# Patient Record
Sex: Female | Born: 1970 | Race: White | Hispanic: No | Marital: Married | Smoking: Never smoker
Health system: Southern US, Community
[De-identification: ages and names within clinical notes are randomized; demographics above are authoritative.]

## PROBLEM LIST (undated history)

## (undated) DIAGNOSIS — R896 Abnormal cytological findings in specimens from other organs, systems and tissues: Secondary | ICD-10-CM

## (undated) DIAGNOSIS — K219 Gastro-esophageal reflux disease without esophagitis: Secondary | ICD-10-CM

## (undated) DIAGNOSIS — J45990 Exercise induced bronchospasm: Secondary | ICD-10-CM

## (undated) DIAGNOSIS — G43109 Migraine with aura, not intractable, without status migrainosus: Secondary | ICD-10-CM

## (undated) HISTORY — DX: Abnormal cytological findings in specimens from other organs, systems and tissues: R89.6

## (undated) HISTORY — DX: Exercise induced bronchospasm: J45.990

## (undated) HISTORY — DX: Gastro-esophageal reflux disease without esophagitis: K21.9

## (undated) HISTORY — DX: Migraine with aura, not intractable, without status migrainosus: G43.109

## (undated) HISTORY — PX: BREAST BIOPSY: SHX20

---
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
## 1995-11-07 HISTORY — PX: NASAL SEPTUM SURGERY: SHX37

## 2005-05-18 ENCOUNTER — Other Ambulatory Visit: Admission: RE | Admit: 2005-05-18 | Discharge: 2005-05-18 | Payer: Self-pay | Admitting: Family Medicine

## 2006-12-17 ENCOUNTER — Encounter: Admission: RE | Admit: 2006-12-17 | Discharge: 2006-12-17 | Payer: Self-pay | Admitting: Gastroenterology

## 2009-09-11 ENCOUNTER — Encounter: Admission: RE | Admit: 2009-09-11 | Discharge: 2009-09-11 | Payer: Self-pay | Admitting: Family Medicine

## 2012-10-06 DIAGNOSIS — IMO0001 Reserved for inherently not codable concepts without codable children: Secondary | ICD-10-CM

## 2012-10-06 HISTORY — DX: Reserved for inherently not codable concepts without codable children: IMO0001

## 2013-04-28 ENCOUNTER — Telehealth: Payer: Self-pay | Admitting: Certified Nurse Midwife

## 2013-05-05 ENCOUNTER — Encounter: Payer: Self-pay | Admitting: Certified Nurse Midwife

## 2013-05-05 DIAGNOSIS — K219 Gastro-esophageal reflux disease without esophagitis: Secondary | ICD-10-CM | POA: Insufficient documentation

## 2013-05-06 ENCOUNTER — Ambulatory Visit (INDEPENDENT_AMBULATORY_CARE_PROVIDER_SITE_OTHER): Payer: BC Managed Care – PPO | Admitting: Certified Nurse Midwife

## 2013-05-06 ENCOUNTER — Encounter: Payer: Self-pay | Admitting: Certified Nurse Midwife

## 2013-05-06 VITALS — BP 110/60 | HR 60 | Resp 16 | Ht 69.25 in | Wt 150.0 lb

## 2013-05-06 DIAGNOSIS — N912 Amenorrhea, unspecified: Secondary | ICD-10-CM

## 2013-05-06 DIAGNOSIS — Z3201 Encounter for pregnancy test, result positive: Secondary | ICD-10-CM

## 2013-05-06 NOTE — Progress Notes (Signed)
42 yo go po married female here with complaint of amenorrhea since approximately 03-30-13. Has been trying for pregnancy over the past few months. Positive pregnancy test at home on 04-23-13, repeated again last week still positive. Patient complaining of some fatigue and breast tenderness, for the past 2 weeks. Denies nausea or vomiting. Medication use is OTC prenatal vitamins, Zyrtec prn, Prevacid for chronic stomach issues. Denies smoking or alcohol use. Denies cramping or bleeding. Noted one touch of color on toilet tissue the day her period should have started, only. No health issues. Eating well now.  O:Healthy female, WDWN Affect: normal, orientation x 3 Last aex 10-15-12 all normal with Pap smear negative and HPVHR negative  A: Amenorrhea with positive UPT Planned pregnancy at approximately 6 weeks by estimated LMP, EDC  01-04-14 estimated by same  P: Reviewed warning signs and symptoms of early pregnancy and need to advise.  Discussed Zyrtec not recommended in pregnancy, can use Claritin if absolutely necessary. Continue prenatal vitamins daily, and use Prevacid only as needed.  Reviewed nutritional needs in pregnancy and exercise. Questions addressed at length. Given OB referral list with instructions to schedule by 8 weeks in pregnancy, due to [facility].  Patient agreeable to schedule, desires no PUS here for viability.  Patient to call with appointment and with practice name, so records can be sent. Wished well with pregnancy.  Rv prn

## 2013-05-06 NOTE — Progress Notes (Signed)
Reviewed CPRomine, MD 

## 2013-05-13 ENCOUNTER — Encounter: Payer: Self-pay | Admitting: Certified Nurse Midwife

## 2013-06-11 ENCOUNTER — Other Ambulatory Visit: Payer: Self-pay

## 2013-06-11 LAB — OB RESULTS CONSOLE ABO/RH: RH TYPE: POSITIVE

## 2013-06-11 LAB — OB RESULTS CONSOLE GC/CHLAMYDIA
CHLAMYDIA, DNA PROBE: NEGATIVE
GC PROBE AMP, GENITAL: NEGATIVE

## 2013-06-11 LAB — OB RESULTS CONSOLE RUBELLA ANTIBODY, IGM: RUBELLA: IMMUNE

## 2013-06-11 LAB — OB RESULTS CONSOLE RPR: RPR: NONREACTIVE

## 2013-06-11 LAB — OB RESULTS CONSOLE HEPATITIS B SURFACE ANTIGEN: Hepatitis B Surface Ag: NEGATIVE

## 2013-06-11 LAB — OB RESULTS CONSOLE HIV ANTIBODY (ROUTINE TESTING): HIV: NONREACTIVE

## 2013-06-11 LAB — OB RESULTS CONSOLE ANTIBODY SCREEN: Antibody Screen: NEGATIVE

## 2013-07-31 ENCOUNTER — Other Ambulatory Visit (HOSPITAL_COMMUNITY): Payer: Self-pay | Admitting: Obstetrics and Gynecology

## 2013-07-31 DIAGNOSIS — Z3689 Encounter for other specified antenatal screening: Secondary | ICD-10-CM

## 2013-07-31 DIAGNOSIS — O09522 Supervision of elderly multigravida, second trimester: Secondary | ICD-10-CM

## 2013-08-06 ENCOUNTER — Encounter (HOSPITAL_COMMUNITY): Payer: Self-pay | Admitting: Obstetrics and Gynecology

## 2013-08-21 ENCOUNTER — Ambulatory Visit (HOSPITAL_COMMUNITY)
Admission: RE | Admit: 2013-08-21 | Discharge: 2013-08-21 | Disposition: A | Discharge status: Home / Self Care | Payer: BC Managed Care – PPO | Source: Ambulatory Visit | Attending: Obstetrics and Gynecology | Admitting: Obstetrics and Gynecology

## 2013-08-21 ENCOUNTER — Encounter (HOSPITAL_COMMUNITY): Payer: Self-pay

## 2013-08-21 DIAGNOSIS — Z1389 Encounter for screening for other disorder: Secondary | ICD-10-CM | POA: Insufficient documentation

## 2013-08-21 DIAGNOSIS — O09519 Supervision of elderly primigravida, unspecified trimester: Secondary | ICD-10-CM | POA: Insufficient documentation

## 2013-08-21 DIAGNOSIS — Z363 Encounter for antenatal screening for malformations: Secondary | ICD-10-CM | POA: Insufficient documentation

## 2013-08-21 DIAGNOSIS — O09522 Supervision of elderly multigravida, second trimester: Secondary | ICD-10-CM

## 2013-08-21 DIAGNOSIS — O358XX0 Maternal care for other (suspected) fetal abnormality and damage, not applicable or unspecified: Secondary | ICD-10-CM | POA: Insufficient documentation

## 2013-08-21 DIAGNOSIS — Z3689 Encounter for other specified antenatal screening: Secondary | ICD-10-CM

## 2013-08-21 LAB — US OB DETAIL + 14 WK

## 2013-09-11 ENCOUNTER — Other Ambulatory Visit: Payer: Self-pay

## 2013-10-20 ENCOUNTER — Ambulatory Visit: Payer: Self-pay | Admitting: Certified Nurse Midwife

## 2013-11-06 NOTE — L&D Delivery Note (Signed)
Delivery Note At 4:06 AM a viable and healthy female was delivered via Vaginal, Spontaneous Delivery (Presentation: right occiput; anterior ).  APGAR: 8, 9; weight pending.   Placenta status: Intact, Spontaneous.  Cord: 3 vessels with a loose nuchal cord x 1  Anesthesia: Epidural  Episiotomy: None Lacerations: 1st degree;Vaginal Suture Repair: 3.0 vicryl and 4-0 vicryl Est. Blood Loss (mL): 350cc  Mom to postpartum.  Baby to Couplet care / Skin to Skin.  Jessejames Steelman H. 01/09/2014, 4:33 AM

## 2013-12-04 LAB — OB RESULTS CONSOLE GBS: STREP GROUP B AG: NEGATIVE

## 2013-12-18 ENCOUNTER — Ambulatory Visit: Payer: BC Managed Care – PPO | Admitting: Cardiology

## 2013-12-26 ENCOUNTER — Ambulatory Visit (INDEPENDENT_AMBULATORY_CARE_PROVIDER_SITE_OTHER): Payer: BC Managed Care – PPO | Admitting: Cardiovascular Disease

## 2013-12-26 ENCOUNTER — Encounter: Payer: Self-pay | Admitting: Cardiovascular Disease

## 2013-12-26 VITALS — BP 117/78 | HR 75 | Ht 70.0 in | Wt 193.0 lb

## 2013-12-26 DIAGNOSIS — R42 Dizziness and giddiness: Secondary | ICD-10-CM

## 2013-12-26 DIAGNOSIS — R002 Palpitations: Secondary | ICD-10-CM

## 2013-12-26 NOTE — Assessment & Plan Note (Signed)
Benign sounding Normal exam and ECG  Echo to assess for structural heart disease Should be fine with delivery in next week or two

## 2013-12-26 NOTE — Patient Instructions (Signed)
Your physician recommends that you continue on your current medications as directed. Please refer to the Current Medication list given to you today.  Your physician has requested that you have an echocardiogram. Echocardiography is a painless test that uses sound waves to create images of your heart. It provides your doctor with information about the size and shape of your heart and how well your heart's chambers and valves are working. This procedure takes approximately one hour. There are no restrictions for this procedure.  Your physician recommends that you schedule a follow-up appointment in: first available after echo and pt delivers child.

## 2013-12-26 NOTE — Progress Notes (Signed)
Patient ID: Catherine GriffinsVirginia Juarez, female   DOB: [birth year], 43 y.o.   MRN: [id number]   43 [facility].  She is nearly [redacted] weeks pregnant with due date 01/06/14  First pregnancy.  No preeclampsia or gestational diabetes.  Palpitations worse laying on back.  Has had through out pregnancy 3-4 x / week Not every day.  Chest just doesn't feel right.  No prior history Has a brother and sister that are healthy.  No smoking, drugs or ETOH. Walking on regular basis.  One or two episodes during pregnancy of postural dizzyness.      ROS: Denies fever, malais, weight loss, blurry vision, decreased visual acuity, cough, sputum, SOB, hemoptysis, pleuritic pain, palpitaitons, heartburn, abdominal pain, melena, lower extremity edema, claudication, or rash.  All other systems reviewed and negative   General: Affect appropriate Healthy:  appears stated age HEENT: normal Neck supple with no adenopathy JVP normal no bruits no thyromegaly Lungs clear with no wheezing and good diaphragmatic motion Heart:  S1/S2 no murmur,rub, gallop or click PMI normal, no AAA Abdomen: benighn, consitant with gestational age  no bruit.  No HSM or HJR Distal pulses intact with no bruits No edema Neuro non-focal Skin warm and dry No muscular weakness  Medications Current Outpatient Prescriptions  Medication Sig Dispense Refill  . Cetirizine HCl (ZYRTEC PO) Take by mouth. occ      . lansoprazole (PREVACID) 30 MG capsule Take 30 mg by mouth daily.      . Prenatal Vit-Fe Sulfate-FA (PRENATAL VITAMIN PO) Take by mouth.       No current facility-administered medications for this visit.    Allergies Penicillins  Family History: Family History  Problem Relation Age of Onset  . Osteoporosis Mother   . Endometriosis Mother   . Colon cancer Paternal Grandmother   . Endometriosis Sister     Social History: History   Social History  . Marital Status: Married    Spouse Name: N/A    Number of Children:  N/A  . Years of Education: N/A   Occupational History  . Not on file.   Social History Main Topics  . Smoking status: Never Smoker   . Smokeless tobacco: Not on file  . Alcohol Use: No  . Drug Use: No  . Sexual Activity: Yes    Partners: Male    Birth Control/ Protection: None   Other Topics Concern  . Not on file   Social History Narrative  . No narrative on file    Electrocardiogram:  NSR normal ECG   Assessment and Plan

## 2014-01-07 ENCOUNTER — Inpatient Hospital Stay (HOSPITAL_COMMUNITY)
Admission: AD | Admit: 2014-01-07 | Discharge: 2014-01-11 | DRG: 775 | Disposition: A | Discharge status: Home / Self Care | Payer: BC Managed Care – PPO | Source: Ambulatory Visit | Attending: Obstetrics and Gynecology | Admitting: Obstetrics and Gynecology

## 2014-01-07 ENCOUNTER — Inpatient Hospital Stay (HOSPITAL_COMMUNITY)
Admission: RE | Admit: 2014-01-07 | Discharge: 2014-01-07 | Disposition: A | Discharge status: Home / Self Care | Payer: BC Managed Care – PPO | Source: Ambulatory Visit | Attending: Obstetrics and Gynecology | Admitting: Obstetrics and Gynecology

## 2014-01-07 ENCOUNTER — Telehealth (HOSPITAL_COMMUNITY): Payer: Self-pay | Admitting: *Deleted

## 2014-01-07 ENCOUNTER — Encounter (HOSPITAL_COMMUNITY): Payer: Self-pay | Admitting: *Deleted

## 2014-01-07 ENCOUNTER — Other Ambulatory Visit: Payer: Self-pay | Admitting: Obstetrics and Gynecology

## 2014-01-07 DIAGNOSIS — K219 Gastro-esophageal reflux disease without esophagitis: Secondary | ICD-10-CM | POA: Diagnosis present

## 2014-01-07 DIAGNOSIS — O09519 Supervision of elderly primigravida, unspecified trimester: Secondary | ICD-10-CM | POA: Diagnosis present

## 2014-01-07 DIAGNOSIS — O480 Post-term pregnancy: Secondary | ICD-10-CM | POA: Diagnosis present

## 2014-01-07 DIAGNOSIS — J45909 Unspecified asthma, uncomplicated: Secondary | ICD-10-CM | POA: Diagnosis present

## 2014-01-07 LAB — CBC
HEMATOCRIT: 33.2 % — AB (ref 36.0–46.0)
Hemoglobin: 11.5 g/dL — ABNORMAL LOW (ref 12.0–15.0)
MCH: 29.3 pg (ref 26.0–34.0)
MCHC: 34.6 g/dL (ref 30.0–36.0)
MCV: 84.5 fL (ref 78.0–100.0)
PLATELETS: 295 10*3/uL (ref 150–400)
RBC: 3.93 MIL/uL (ref 3.87–5.11)
RDW: 12.6 % (ref 11.5–15.5)
WBC: 12 10*3/uL — AB (ref 4.0–10.5)

## 2014-01-07 LAB — TYPE AND SCREEN
ABO/RH(D): A POS
Antibody Screen: NEGATIVE

## 2014-01-07 MED ORDER — MISOPROSTOL 25 MCG QUARTER TABLET
25.0000 ug | ORAL_TABLET | ORAL | Status: DC | PRN
  Administered 2014-01-07: 25 ug via VAGINAL
  Filled 2014-01-07: qty 0.25
  Filled 2014-01-07: qty 1

## 2014-01-07 MED ORDER — ONDANSETRON HCL 4 MG/2ML IJ SOLN
4.0000 mg | Freq: Four times a day (QID) | INTRAMUSCULAR | Status: DC | PRN
  Administered 2014-01-08: 4 mg via INTRAVENOUS
  Filled 2014-01-07: qty 2

## 2014-01-07 MED ORDER — ZOLPIDEM TARTRATE 5 MG PO TABS
5.0000 mg | ORAL_TABLET | Freq: Every evening | ORAL | Status: DC | PRN
  Administered 2014-01-08: 5 mg via ORAL
  Filled 2014-01-07: qty 1

## 2014-01-07 MED ORDER — OXYCODONE-ACETAMINOPHEN 5-325 MG PO TABS: 1.0000 | ORAL_TABLET | ORAL | Status: DC | PRN

## 2014-01-07 MED ORDER — CITRIC ACID-SODIUM CITRATE 334-500 MG/5ML PO SOLN
30.0000 mL | ORAL | Status: DC | PRN
  Administered 2014-01-08: 30 mL via ORAL
  Filled 2014-01-07: qty 15

## 2014-01-07 MED ORDER — LACTATED RINGERS IV SOLN: 500.0000 mL | INTRAVENOUS | Status: DC | PRN

## 2014-01-07 MED ORDER — OXYTOCIN BOLUS FROM INFUSION
500.0000 mL | INTRAVENOUS | Status: DC
  Administered 2014-01-09: 500 mL via INTRAVENOUS

## 2014-01-07 MED ORDER — BUTORPHANOL TARTRATE 1 MG/ML IJ SOLN
1.0000 mg | INTRAMUSCULAR | Status: DC | PRN
  Administered 2014-01-08: 1 mg via INTRAVENOUS
  Filled 2014-01-07: qty 1

## 2014-01-07 MED ORDER — OXYTOCIN 40 UNITS IN LACTATED RINGERS INFUSION - SIMPLE MED
62.5000 mL/h | INTRAVENOUS | Status: DC
  Administered 2014-01-09: 62.5 mL/h via INTRAVENOUS
  Filled 2014-01-07: qty 1000

## 2014-01-07 MED ORDER — IBUPROFEN 600 MG PO TABS: 600.0000 mg | ORAL_TABLET | Freq: Four times a day (QID) | ORAL | Status: DC | PRN

## 2014-01-07 MED ORDER — LACTATED RINGERS IV SOLN
INTRAVENOUS | Status: DC
  Administered 2014-01-07 – 2014-01-08 (×3): via INTRAVENOUS

## 2014-01-07 MED ORDER — TERBUTALINE SULFATE 1 MG/ML IJ SOLN: 0.2500 mg | Freq: Once | INTRAMUSCULAR | Status: AC | PRN

## 2014-01-07 MED ORDER — ACETAMINOPHEN 325 MG PO TABS: 650.0000 mg | ORAL_TABLET | ORAL | Status: DC | PRN

## 2014-01-07 MED ORDER — LIDOCAINE HCL (PF) 1 % IJ SOLN
30.0000 mL | INTRAMUSCULAR | Status: DC | PRN
  Filled 2014-01-07: qty 30

## 2014-01-07 NOTE — Telephone Encounter (Signed)
Preadmission screen  

## 2014-01-08 ENCOUNTER — Inpatient Hospital Stay (HOSPITAL_COMMUNITY): Payer: BC Managed Care – PPO | Admitting: Anesthesiology

## 2014-01-08 ENCOUNTER — Encounter (HOSPITAL_COMMUNITY): Payer: BC Managed Care – PPO | Admitting: Anesthesiology

## 2014-01-08 LAB — RPR: RPR Ser Ql: NONREACTIVE

## 2014-01-08 LAB — ABO/RH: ABO/RH(D): A POS

## 2014-01-08 MED ORDER — EPHEDRINE 5 MG/ML INJ
10.0000 mg | INTRAVENOUS | Status: DC | PRN
  Filled 2014-01-08: qty 2
  Filled 2014-01-08: qty 4

## 2014-01-08 MED ORDER — FENTANYL 2.5 MCG/ML BUPIVACAINE 1/10 % EPIDURAL INFUSION (WH - ANES)
14.0000 mL/h | INTRAMUSCULAR | Status: DC | PRN
  Administered 2014-01-08: 14 mL/h via EPIDURAL
  Administered 2014-01-08: 16 mL/h via EPIDURAL
  Filled 2014-01-08 (×3): qty 125

## 2014-01-08 MED ORDER — SODIUM BICARBONATE 8.4 % IV SOLN
INTRAVENOUS | Status: DC | PRN
  Administered 2014-01-08: 5 mL via EPIDURAL

## 2014-01-08 MED ORDER — EPHEDRINE 5 MG/ML INJ
10.0000 mg | INTRAVENOUS | Status: DC | PRN
  Filled 2014-01-08: qty 2

## 2014-01-08 MED ORDER — OXYTOCIN 40 UNITS IN LACTATED RINGERS INFUSION - SIMPLE MED
1.0000 m[IU]/min | INTRAVENOUS | Status: DC
Start: 2014-01-08 — End: 2014-01-09
  Administered 2014-01-08: 2 m[IU]/min via INTRAVENOUS
  Filled 2014-01-08: qty 1000

## 2014-01-08 MED ORDER — PHENYLEPHRINE 40 MCG/ML (10ML) SYRINGE FOR IV PUSH (FOR BLOOD PRESSURE SUPPORT)
80.0000 ug | PREFILLED_SYRINGE | INTRAVENOUS | Status: DC | PRN
  Filled 2014-01-08: qty 2

## 2014-01-08 MED ORDER — PHENYLEPHRINE 40 MCG/ML (10ML) SYRINGE FOR IV PUSH (FOR BLOOD PRESSURE SUPPORT)
80.0000 ug | PREFILLED_SYRINGE | INTRAVENOUS | Status: DC | PRN
  Filled 2014-01-08: qty 10
  Filled 2014-01-08: qty 2

## 2014-01-08 MED ORDER — ZOLPIDEM TARTRATE 5 MG PO TABS: 5.0000 mg | ORAL_TABLET | Freq: Every evening | ORAL | Status: DC | PRN

## 2014-01-08 MED ORDER — LACTATED RINGERS IV SOLN: 500.0000 mL | Freq: Once | INTRAVENOUS | Status: DC

## 2014-01-08 MED ORDER — DIPHENHYDRAMINE HCL 50 MG/ML IJ SOLN: 12.5000 mg | INTRAMUSCULAR | Status: DC | PRN

## 2014-01-08 MED ORDER — TERBUTALINE SULFATE 1 MG/ML IJ SOLN: 0.2500 mg | Freq: Once | INTRAMUSCULAR | Status: AC | PRN

## 2014-01-08 NOTE — H&P (Signed)
[place] Luc[place] AxeComer is a 43 y.o. female presenting for PD IOL  43 yo G1P0 @ 40+5 presents for a PD IOL.  The patient's pregnancy has been complicated by advanced maternal age. Patient declined genetic testing.  She has had intermittent palpitations. History OB History   Grav Para Term Preterm Abortions TAB SAB Ect Mult Living   1 0 0 0 0 0 0 0 0 0      Past Medical History  Diagnosis Date  . Migraine with aura   . 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 Acid reflux   . Exercise-induced asthma    Past Surgical History  Procedure Laterality Date  . Appendectomy  1992  . Nasal septum surgery  1997   Family History: family history includes Colon cancer in her paternal grandmother; Endometriosis in her mother and sister; Osteoporosis in her mother. Social History:  reports that she has never smoked. She has never used smokeless tobacco. She reports that she does not drink alcohol or use illicit drugs.   Prenatal Transfer Tool  Maternal Diabetes: No Genetic Screening: Declined Maternal Ultrasounds/Referrals: Normal Fetal Ultrasounds or other Referrals:  None Maternal Substance Abuse:  No Significant Maternal Medications:  None Significant Maternal Lab Results:  None Other Comments:  None  ROS  Dilation: 5.5 Effacement (%): 70 Station: -1 Exam by:: dr Tenny Crawross Blood pressure 124/70, pulse 71, temperature 98.1 F (36.7 C), temperature source Oral, resp. rate 20, height 5\' 10"  (1.778 m), weight 88.451 kg (195 lb), last menstrual period 03/29/2013, SpO2 100.00%. Exam Physical Exam  Prenatal labs: ABO, Rh: --/--/A POS, A POS (03/04 2015) Antibody: NEG (03/04 2015) Rubella: Immune (08/06 0000) RPR: NON REACTIVE (03/04 2015)  HBsAg: Negative (08/06 0000)  HIV: Non-reactive (08/06 0000)  GBS: Negative (01/29 0000)   Assessment/Plan: 1) Admit 2) Misoprostal 25cmg per vagina Q 4 hrs 3) Epidural on request     Lachrista Heslin H. 01/08/2014, 7:49 PM

## 2014-01-08 NOTE — Progress Notes (Signed)
Patient ID: Susa GriffinsVirginia Juarez, female   DOB: 1970/12/21, 43 y.o.   MRN: [id number]   S: Comfortable with epidural O:  Filed Vitals:   01/08/14 1730 01/08/14 1800 01/08/14 1830 01/08/14 1900  BP: 118/67 119/72 122/75 124/70  Pulse: 65 58 66 71  Temp:  98.1 F (36.7 C)    TempSrc:  Oral    Resp: 20 20 20 20   Height:      Weight:      SpO2:       FHT: 120-130s reactive Cvx: 5-6/80/-1 toco Q 2 minutes by IUPC with adequate MVUs since 6pm  A/P 1) foley bulb placed at 10 this am after patient had epidural placed. Foley out at 1:30 pm and patient was 6/70/-2. Amniotomy performed at that time.  IUPC placed at 6 pm. Pt currently on 9 mun of pitocin. Labor has been adequate since IUPC placement.  Will wait an additional 3 hours for a total of 6 hrs of adequate labor.  If no cervical change will proceed with cesarean section at that time for failure to progress. Discussed concerns with the patient. Fetal well being reassuring.

## 2014-01-08 NOTE — Anesthesia Procedure Notes (Signed)

## 2014-01-08 NOTE — Anesthesia Preprocedure Evaluation (Addendum)
Anesthesia Evaluation  Patient identified by MRN, date of birth, ID band Patient awake    Reviewed: Allergy & Precautions, H&P , Patient's Chart, lab work & pertinent test results  Airway Mallampati: II  TM Distance: >3 FB Neck ROM: full    Dental  (+) Teeth Intact   Pulmonary asthma ,  breath sounds clear to auscultation        Cardiovascular Rhythm:regular Rate:Normal     Neuro/Psych    GI/Hepatic GERD-  Medicated,  Endo/Other    Renal/GU      Musculoskeletal   Abdominal   Peds  Hematology   Anesthesia Other Findings       Reproductive/Obstetrics (+) Pregnancy                            Anesthesia Physical Anesthesia Plan  ASA: II  Anesthesia Plan: Epidural   Post-op Pain Management:    Induction:   Airway Management Planned:   Additional Equipment:   Intra-op Plan:   Post-operative Plan:   Informed Consent: I have reviewed the patients History and Physical, chart, labs and discussed the procedure including the risks, benefits and alternatives for the proposed anesthesia with the patient or authorized representative who has indicated his/her understanding and acceptance.   Dental Advisory Given  Plan Discussed with:   Anesthesia Plan Comments: (Labs checked- platelets confirmed with RN in room. Fetal heart tracing, per RN, reported to be stable enough for sitting procedure. Discussed epidural, and patient consents to the procedure:  included risk of possible headache,backache, failed block, allergic reaction, and nerve injury. This patient was asked if she had any questions or concerns before the procedure started.)        Anesthesia Quick Evaluation  

## 2014-01-09 ENCOUNTER — Encounter (HOSPITAL_COMMUNITY): Payer: Self-pay | Admitting: *Deleted

## 2014-01-09 LAB — CBC
HEMATOCRIT: 29.7 % — AB (ref 36.0–46.0)
Hemoglobin: 10.3 g/dL — ABNORMAL LOW (ref 12.0–15.0)
MCH: 29.3 pg (ref 26.0–34.0)
MCHC: 34.7 g/dL (ref 30.0–36.0)
MCV: 84.4 fL (ref 78.0–100.0)
PLATELETS: 207 10*3/uL (ref 150–400)
RBC: 3.52 MIL/uL — ABNORMAL LOW (ref 3.87–5.11)
RDW: 12.6 % (ref 11.5–15.5)
WBC: 25.5 10*3/uL — ABNORMAL HIGH (ref 4.0–10.5)

## 2014-01-09 MED ORDER — DIPHENHYDRAMINE HCL 25 MG PO CAPS: 25.0000 mg | ORAL_CAPSULE | Freq: Four times a day (QID) | ORAL | Status: DC | PRN

## 2014-01-09 MED ORDER — SENNOSIDES-DOCUSATE SODIUM 8.6-50 MG PO TABS
2.0000 | ORAL_TABLET | ORAL | Status: DC
  Administered 2014-01-10 – 2014-01-11 (×2): 2 via ORAL
  Filled 2014-01-09 (×2): qty 2

## 2014-01-09 MED ORDER — SIMETHICONE 80 MG PO CHEW: 80.0000 mg | CHEWABLE_TABLET | ORAL | Status: DC | PRN

## 2014-01-09 MED ORDER — ONDANSETRON HCL 4 MG/2ML IJ SOLN
4.0000 mg | INTRAMUSCULAR | Status: DC | PRN
Start: 2014-01-09 — End: 2014-01-11

## 2014-01-09 MED ORDER — ONDANSETRON HCL 4 MG PO TABS: 4.0000 mg | ORAL_TABLET | ORAL | Status: DC | PRN

## 2014-01-09 MED ORDER — IBUPROFEN 600 MG PO TABS
600.0000 mg | ORAL_TABLET | Freq: Four times a day (QID) | ORAL | Status: DC
  Administered 2014-01-09 – 2014-01-11 (×9): 600 mg via ORAL
  Filled 2014-01-09 (×9): qty 1

## 2014-01-09 MED ORDER — METHYLERGONOVINE MALEATE 0.2 MG/ML IJ SOLN: 0.2000 mg | INTRAMUSCULAR | Status: DC | PRN

## 2014-01-09 MED ORDER — DIBUCAINE 1 % RE OINT
1.0000 "application " | TOPICAL_OINTMENT | RECTAL | Status: DC | PRN
  Administered 2014-01-09: 1 via RECTAL
  Filled 2014-01-09: qty 28

## 2014-01-09 MED ORDER — LANOLIN HYDROUS EX OINT: TOPICAL_OINTMENT | CUTANEOUS | Status: DC | PRN

## 2014-01-09 MED ORDER — OXYCODONE-ACETAMINOPHEN 5-325 MG PO TABS: 1.0000 | ORAL_TABLET | ORAL | Status: DC | PRN

## 2014-01-09 MED ORDER — TETANUS-DIPHTH-ACELL PERTUSSIS 5-2.5-18.5 LF-MCG/0.5 IM SUSP
0.5000 mL | Freq: Once | INTRAMUSCULAR | Status: AC
  Administered 2014-01-10: 0.5 mL via INTRAMUSCULAR
  Filled 2014-01-09: qty 0.5

## 2014-01-09 MED ORDER — METHYLERGONOVINE MALEATE 0.2 MG PO TABS: 0.2000 mg | ORAL_TABLET | ORAL | Status: DC | PRN

## 2014-01-09 MED ORDER — WITCH HAZEL-GLYCERIN EX PADS: 1.0000 "application " | MEDICATED_PAD | CUTANEOUS | Status: DC | PRN

## 2014-01-09 MED ORDER — BENZOCAINE-MENTHOL 20-0.5 % EX AERO
1.0000 "application " | INHALATION_SPRAY | CUTANEOUS | Status: DC | PRN
  Administered 2014-01-09: 1 via TOPICAL
  Filled 2014-01-09: qty 56

## 2014-01-09 MED ORDER — ZOLPIDEM TARTRATE 5 MG PO TABS: 5.0000 mg | ORAL_TABLET | Freq: Every evening | ORAL | Status: DC | PRN

## 2014-01-09 MED ORDER — PRENATAL MULTIVITAMIN CH
1.0000 | ORAL_TABLET | Freq: Every day | ORAL | Status: DC
  Administered 2014-01-09 – 2014-01-11 (×3): 1 via ORAL
  Filled 2014-01-09 (×3): qty 1

## 2014-01-09 NOTE — Progress Notes (Signed)
PPDO#0 early this morning. Patient is eating, ambulating, voiding.  Pain control is good.  Appropriate lochia.  No complaints. No CP/SOB/leg pain.  Filed Vitals:   01/09/14 0445 01/09/14 0500 01/09/14 0630 01/09/14 0730  BP: 117/71 113/65 101/55 102/60  Pulse: 75 75 77 69  Temp:   97.6 F (36.4 C) 97.9 F (36.6 C)  TempSrc:   Oral Oral  Resp:  18 18 18   Height:      Weight:      SpO2:   97% 98%    Fundus firm No CT  Lab Results  Component Value Date   WBC 25.5* 01/09/2014   HGB 10.3* 01/09/2014   HCT 29.7* 01/09/2014   MCV 84.4 01/09/2014   PLT 207 01/09/2014    --/--/A POS, A POS (03/04 2015)  A/P Post partum day 0. Doing well.  Elevated WBC, will monitor for fever.  Routine care.  Expect d/c 3/7.    Philip AspenALLAHAN, Dontrell Stuck

## 2014-01-09 NOTE — Lactation Note (Signed)
This note was copied from the chart of Catherine Juarez. Lactation Consultation Note  Patient Name: Catherine Juarez Today's Date: 01/09/2014 Reason for consult: Initial assessment RN assisted mother with latching baby to breast using a #24 nipple shield. RN states they made several attempts following using a hand pump. Nipple was more erect after hand pumping but baby was not able to sustain the latch. RN requested LC to assess the latch and fit of shield. Arrived to room. Mother had just completed the feeding. Baby was alert and content. Infant fed well per mom. Instructed to call with next feeding. Lactation brochure given with explanation of services following discharge. Several visitors in the room. LC to follow due to difficult latch.  Maternal Data Infant to breast within first hour of birth: Yes (attempted but unable to latch) Does the patient have breastfeeding experience prior to this delivery?: No  Feeding    LATCH Score/Interventions                      Lactation Tools Discussed/Used WIC Program: No   Consult Status      Catherine Juarez, Catherine Juarez 01/09/2014, 4:14 PM

## 2014-01-09 NOTE — Anesthesia Postprocedure Evaluation (Signed)
Anesthesia Post Note  Patient: Catherine Juarez  Procedure(s) Performed: * No procedures listed *  Anesthesia type: Epidural  Patient location: Mother/Baby  Post pain: Pain level controlled  Post assessment: Post-op Vital signs reviewed  Last Vitals:  Filed Vitals:   01/09/14 1130  BP: 105/57  Pulse: 65  Temp: 36.4 C  Resp: 18    Post vital signs: Reviewed  Level of consciousness:alert  Complications: No apparent anesthesia complications

## 2014-01-10 NOTE — Lactation Note (Signed)
This note was copied from the chart of Catherine Juarez. Lactation Consultation Note  Patient Name: Catherine [place] Eslick Today's Date: 01/10/2014 Reason for consult: Follow-up assessment;Breast/nipple pain;Difficult latch Mom reports baby has been at the breast many times today, she reports hearing some swallows with earlier feedings. She is not using the nipple shield. Mom reports some nipple tenderness that she is using Lanolin for comfort. Assisted Mom with latching baby to left breast in football hold. [facility] few attempts and [facility] demonstrating a good rhythmic suck, however after the 1st 5-10 minutes became very sleepy, lots of stimulation needed to keep baby actively nursing. After 15 mintues, changed to cross cradle and baby appeared to have deeper latch and was more engaged at the breast. Mom's nipple had compression line with football hold. With cross cradle nipple was more round. Few drops of colostrum visible with hand expression. Few swallows noted.  Assisted Mom with latching baby in side lying position on the right breast. After few attempts baby latched and sustained the latch. Sleepy again, but nursed off an on for 20 minutes. Mom reports discomfort regardless of position but with cross cradle and side lying Mom's nipple was not as compressed and baby appeared to have a deeper latch. Mom's nipple have short nipple shaft. Care for sore nipples reviewed, comfort gels given with instructions. Baby passed gas at this visit, abdomen is soft and not distended. Set up DEBP for Mom to post pump if baby is sleepy at the breast. Advised to use preemie setting. If Mom receives any EBM advised to give back to baby. Advised to call for assist as needed.   Maternal Data    Feeding Feeding Type: Breast Fed Length of feed: 20 min  LATCH Score/Interventions Latch: Repeated attempts needed to sustain latch, nipple held in mouth throughout feeding, stimulation needed to elicit sucking  reflex. Intervention(s): Adjust position;Assist with latch;Breast massage;Breast compression  Audible Swallowing: A few with stimulation  Type of Nipple: Everted at rest and after stimulation (short nipple shaft) Intervention(s): Shells;Hand pump  Comfort (Breast/Nipple): Filling, red/small blisters or bruises, mild/mod discomfort  Problem noted: Mild/Moderate discomfort Interventions (Mild/moderate discomfort): Comfort gels;Hand expression;Hand massage  Hold (Positioning): Assistance needed to correctly position infant at breast and maintain latch. Intervention(s): Breastfeeding basics reviewed;Support Pillows;Position options  LATCH Score: 6  Lactation Tools Discussed/Used Tools: Pump;Shells;Nipple Shields;Comfort gels Nipple shield size: 24 Shell Type: Inverted Breast pump type: Double-Electric Breast Pump Pump Review: Setup, frequency, and cleaning;Milk Storage Initiated by:: KG Date initiated:: 01/10/14   Consult Status Consult Status: Follow-up Date: 01/11/14 Follow-up type: In-patient    Alfred LevinsGranger, Launa Goedken Ann 01/10/2014, 8:22 PM

## 2014-01-10 NOTE — Progress Notes (Signed)
Patient is eating, ambulating, voiding.  Pain control is good.  Appropriate lochia.  No complaints.  Filed Vitals:   01/09/14 1130 01/09/14 1724 01/09/14 1945 01/10/14 0540  BP: 105/57 105/62 99/59 104/69  Pulse: 65 73 74 72  Temp: 97.6 F (36.4 C) 97.6 F (36.4 C) 98 F (36.7 C) 97.5 F (36.4 C)  TempSrc: Oral Oral Oral Oral  Resp: 18 18 18 18   Height:      Weight:      SpO2: 100%  100%     Fundus firm Perineum without swelling. No CT  Lab Results  Component Value Date   WBC 25.5* 01/09/2014   HGB 10.3* 01/09/2014   HCT 29.7* 01/09/2014   MCV 84.4 01/09/2014   PLT 207 01/09/2014    --/--/A POS, A POS (03/04 2015)  A/P Post partum day 1. Doing well.  Routine care.  Expect d/c 3/8.    Philip AspenALLAHAN, Amaurie Schreckengost

## 2014-01-11 NOTE — Discharge Summary (Addendum)
Obstetric Discharge Summary Reason for Admission: induction of labor Prenatal Procedures: ultrasound Intrapartum Procedures: spontaneous vaginal delivery Postpartum Procedures: none Complications-Operative and Postpartum: 1st degree vaginal Hemoglobin  Date Value Ref Range Status  01/09/2014 10.3* 12.0 - 15.0 g/dL Final     HCT  Date Value Ref Range Status  01/09/2014 29.7* 36.0 - 46.0 % Final    Physical Exam:  General: alert and cooperative Lochia: appropriate Uterine Fundus: firm DVT Evaluation: No evidence of DVT seen on physical exam.  Discharge Diagnoses: Term Pregnancy-delivered  Discharge Information: Date: 01/11/2014 Activity: pelvic rest Diet: routine Medications: PNV and Ibuprofen Condition: stable Instructions: refer to practice specific booklet Discharge to: home Follow-up Information   Follow up with Almon HerculesOSS,KENDRA H., MD In 4 weeks.   Specialty:  Obstetrics and Gynecology   Contact information:   [address] [place] [id number] [phone]       Newborn Data: Live born female  Birth Weight: 7 lb 8.3 oz (3410 g) APGAR: 8, 9  Home with mother.  Philip AspenCALLAHAN, Catherine Ungerer 01/11/2014, 11:21 AM

## 2014-01-14 ENCOUNTER — Ambulatory Visit (HOSPITAL_COMMUNITY)
Admission: RE | Admit: 2014-01-14 | Discharge: 2014-01-14 | Disposition: A | Discharge status: Home / Self Care | Payer: BC Managed Care – PPO | Source: Ambulatory Visit | Attending: Obstetrics and Gynecology | Admitting: Obstetrics and Gynecology

## 2014-01-14 ENCOUNTER — Ambulatory Visit: Payer: Self-pay

## 2014-01-14 NOTE — Lactation Note (Addendum)
This note was copied from the chart of Catherine Juarez. Infant Lactation Consultation Outpatient Visit Note  Patient Name: Catherine Juarez Date of Birth: 01/09/2012 Birth Weight:  7 lb 8.3 oz (3410 g) Gestational Age at Delivery: Gestational Age: 8106w6d Type of Delivery:  D/C weight - 7-0.2 oz 3180g 7% WEIGHT LOSS  3/9 DR. Weight - 7-0 oz  Today's weight - 7-2.7 oz 3252g   Breastfeeding History- per mom milk came in at 3 days , sore nipples have improved and using all purpose nipple cream  And the nipple shield  Frequency of Breastfeeding:every 2-3 hours  Length of Feeding: 15-45 mins and snacks  Voids: 3 ( PROBABLY SOME WITH STOOLS )  Stools: 7-8   Supplementing / Method: Pumping:  Type of Pump: per mom DEBP Medela    Frequency: per mom has only pumped x1 in the last day                                  Milk came @day  #3 , and yesterday was pumping off 23 ml as the most volume   Volume:    Comments:per mom pumping is comfortable     Consultation Evaluation: Both breast full , no engorgement or plugged ducts noted, Both nipples still have positional strips .( intact )  Per mom they are so much better than in the hospital . The all purpose nipple cream is really helping. LC reviewed sore nipple prevention and tx . See plan below. Baby awake and rooting , moist mucous membranes , small wet diaper changed by LC.   Initial Feeding Assessment:------------------------------Per mom last feeding @1040  am 35 mins and also using syringe with EBM in the top of the Nipple shield  Pre-feed Weight: 7-2.7 oz 3252g  Post-feed Weight: 7-3.2 oz 3266 g  Amount Transferred: 14 ml  Comments: Mom latched the baby in cross cradle right breast , baby fed 15 mins , with #24 Nipple shield ( LC reviewed application of Nipple shield )  Noted a consistent pattern with swallows, increased with breast compressions, milk in th nipple shield when abby finished, Baby started hanging out  so LC had mom release  suction. Wet diaper changed   Additional Feeding Assessment: wet diaper changed  Pre-feed Weight: 7-2.6 oz 3248 g  Post-feed Weight: 7-2.9 oz 3256 g  Amount Transferred: 8 ml  Comments: Re-latched the same breast changing position to football and baby fed for 10 mins , consistent pattern with #24 Nipple shield , multiply swallows , and  Milk in the nipple shield when she finished.   Additional Feeding Assessment:  Pre-feed Weight: 7-2.9 oz 3256 g  Post-feed Weight: 7-3.3 oz 3270g  Amount Transferred: 14 ml  Comments: LC assisted with latching on the left breast with out the nipple shield , per mom initially the discomfort was at 3.5 and ended up being comfortable  And the baby fed for 15 mins with multiply swallows. Large yellowish brown stool after the feeding .  Total Breast milk Transferred this Visit: 36 ml  Total Supplement Given: none   Lactation Plan of Care - Praised mom for her efforts breast feeding                                          - For Mom - naps, plenty fluids ,  esp water, nutritious snacks and meals                                          - Feedings- every 2-3 hours and with feeding cues                                                            - Feed skin to skin                                                            - Try latching without nipple shield , if no latch use nipple shield                  Extra pumping - After 4-6 feeding s day post pump 10 -15 mins and save milk                                              - Used pumped milk in the nipple shield for an appetizer                  Sore nipple and engorgement prevention and tx . After pumping = EBM , cold comfort gels , when warm , remove and apply nipple cream , then shells                                               - Full breast - Good sign - milk is in -                                               - Breast greater than full heading towards firm require Ice and then the steps for latching (  as discussed )                         Follow-Up- Per mom - 01/19/2014 - with Dr. Eustaquio Boyden                   - Per [facility] 3/18 9 am with [facility] - for F/U due to use Nipple shield and sore nipples       Catherine Juarez 01/14/2014, 2:31 PM

## 2014-01-21 ENCOUNTER — Ambulatory Visit (HOSPITAL_COMMUNITY)
Admission: RE | Admit: 2014-01-21 | Discharge: 2014-01-21 | Disposition: A | Discharge status: Home / Self Care | Payer: BC Managed Care – PPO | Source: Ambulatory Visit | Attending: Obstetrics and Gynecology | Admitting: Obstetrics and Gynecology

## 2014-01-21 ENCOUNTER — Ambulatory Visit (HOSPITAL_COMMUNITY): Payer: BC Managed Care – PPO

## 2014-01-30 ENCOUNTER — Ambulatory Visit (HOSPITAL_COMMUNITY)
Admission: RE | Admit: 2014-01-30 | Discharge: 2014-01-30 | Disposition: A | Discharge status: Home / Self Care | Payer: BC Managed Care – PPO | Source: Ambulatory Visit | Attending: Obstetrics and Gynecology | Admitting: Obstetrics and Gynecology

## 2014-02-12 ENCOUNTER — Other Ambulatory Visit (HOSPITAL_COMMUNITY): Payer: BC Managed Care – PPO

## 2014-02-26 ENCOUNTER — Ambulatory Visit: Payer: BC Managed Care – PPO | Admitting: Cardiovascular Disease

## 2014-07-10 ENCOUNTER — Telehealth: Payer: Self-pay | Admitting: Certified Nurse Midwife

## 2014-07-10 NOTE — Telephone Encounter (Signed)
Pt would like to know what birth control she was on before she got pregnant and would like to switch back to it.

## 2014-07-10 NOTE — Telephone Encounter (Signed)
Spoke with patient. Advised patient that she is over due for annual and will need to be scheduled before we can restart her on her birth control. Patient states that she had an annual exam with her OBGYN not even three months ago. Advised patient that we are unable to see that office visit and will need her to have those records faxed over so that Verner Chol CNM can take a look at them before restarting her on anything. Patient is agreeable and will get records faxed over to our office for Verner Chol CNM's review. Advised will pull patient paper chart for Verner Chol CNM to review as well. Advised after she has had time to review both of these things I will give her a call back with further information. Patient is agreeable.

## 2014-07-15 NOTE — Telephone Encounter (Signed)
Spoke with patient. Patient would like to know what birth control she was previously on before getting pregnant. Pulled patients paper chart. Last we have on file is that patient was taking Loestrin 24 Fe. Patient is agreeable and states that she will contact her OBGYN and see if they can get her started on something as she was seen with them for her annual this year. Will call back if she needs [place] to help. Fax received today from her OBGYN with pap results from 02/05/2014. Will send these results to scan to have on file.  Routing to provider for final review. Patient agreeable to disposition. Will close encounter

## 2014-08-21 ENCOUNTER — Other Ambulatory Visit: Payer: Self-pay

## 2014-09-07 ENCOUNTER — Encounter (HOSPITAL_COMMUNITY): Payer: Self-pay | Admitting: *Deleted

## 2015-07-28 ENCOUNTER — Other Ambulatory Visit: Payer: Self-pay | Admitting: Obstetrics and Gynecology

## 2015-07-28 DIAGNOSIS — R928 Other abnormal and inconclusive findings on diagnostic imaging of breast: Secondary | ICD-10-CM

## 2015-08-06 ENCOUNTER — Ambulatory Visit
Admission: RE | Admit: 2015-08-06 | Discharge: 2015-08-06 | Disposition: A | Discharge status: Home / Self Care | Payer: BC Managed Care – PPO | Source: Ambulatory Visit | Attending: Obstetrics and Gynecology | Admitting: Obstetrics and Gynecology

## 2015-08-06 ENCOUNTER — Other Ambulatory Visit: Payer: Self-pay | Admitting: Obstetrics and Gynecology

## 2015-08-06 DIAGNOSIS — N6320 Unspecified lump in the left breast, unspecified quadrant: Secondary | ICD-10-CM

## 2015-08-06 DIAGNOSIS — R928 Other abnormal and inconclusive findings on diagnostic imaging of breast: Secondary | ICD-10-CM

## 2015-08-25 ENCOUNTER — Ambulatory Visit
Admission: RE | Admit: 2015-08-25 | Discharge: 2015-08-25 | Disposition: A | Discharge status: Home / Self Care | Payer: BC Managed Care – PPO | Source: Ambulatory Visit | Attending: Obstetrics and Gynecology | Admitting: Obstetrics and Gynecology

## 2015-08-25 ENCOUNTER — Other Ambulatory Visit: Payer: Self-pay | Admitting: Obstetrics and Gynecology

## 2015-08-25 DIAGNOSIS — N6320 Unspecified lump in the left breast, unspecified quadrant: Secondary | ICD-10-CM

## 2016-04-18 DIAGNOSIS — L219 Seborrheic dermatitis, unspecified: Secondary | ICD-10-CM | POA: Diagnosis not present

## 2016-04-18 DIAGNOSIS — R634 Abnormal weight loss: Secondary | ICD-10-CM | POA: Diagnosis not present

## 2016-04-18 DIAGNOSIS — F900 Attention-deficit hyperactivity disorder, predominantly inattentive type: Secondary | ICD-10-CM | POA: Diagnosis not present

## 2016-04-22 IMAGING — US US LT BREAST BX
1 series · 10 of 10 positions shown · non-contrast
Comparison: Previous exam(s).

ADDENDUM:
Pathology reveals EXTREMELY SCANT FRAGMENTS OF BIPHASIC EPITHELIAL
STROMAL TUMOR, FAVOR FIBROADENOMA of the Left breast at the 11:30
o'clock location. This was found to be concordant by Dr. Delcid
Karnakar. Pathology results were discussed with the patient via
telephone. The patient reported tenderness and minimal bleeding at
the biopsy site and is doing well otherwise. Post biopsy care and
instructions were reviewed and questions were answered. The patient
was encouraged to call The [REDACTED] with
any additional questions and or concerns. The patient was instructed
to perform monthly self breast examinations and to return in 6
months for an ultrasound and mammogram of the Left breast. The
patient was informed a reminder notice would be sent regarding this
appointment.

Pathology results reported by Mikki Mendiola RN on August 26, 2015.
CLINICAL DATA: 44-year-old female presenting for ultrasound-guided
biopsy of a left breast mass.
EXAM:
ULTRASOUND GUIDED LEFT BREAST CORE NEEDLE BIOPSY

[Series 1: us left breast bx · 0.05mm/px · 10 of 10 slices shown]
[im 1/10]
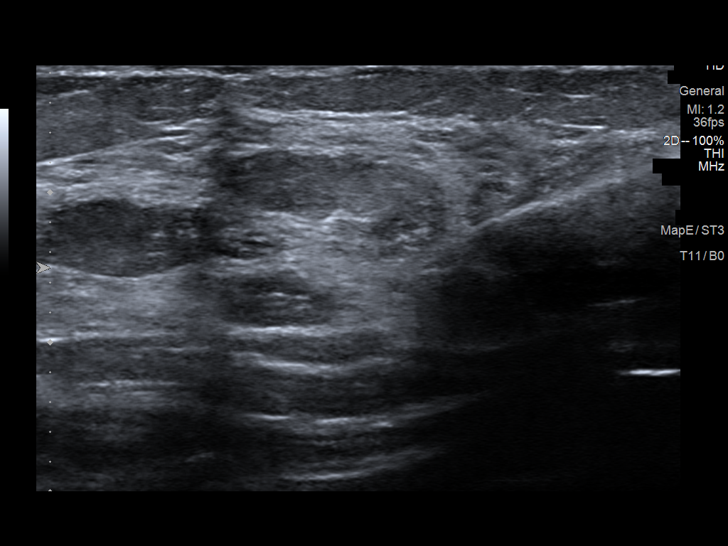
[im 2/10]
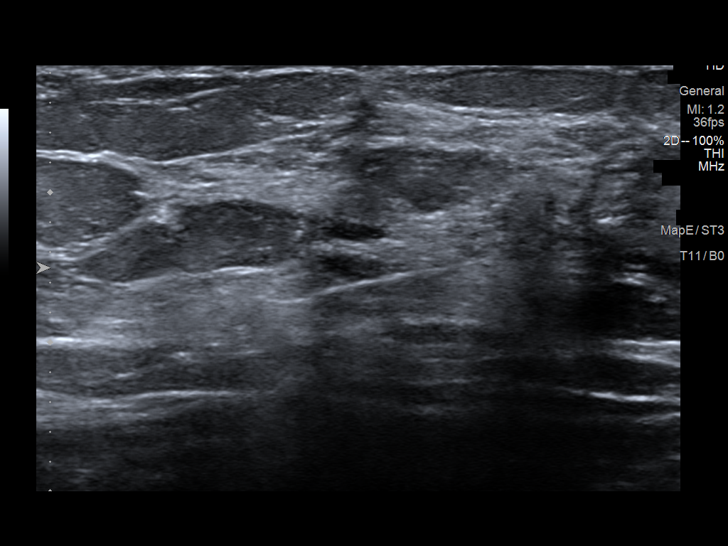
[im 3/10]
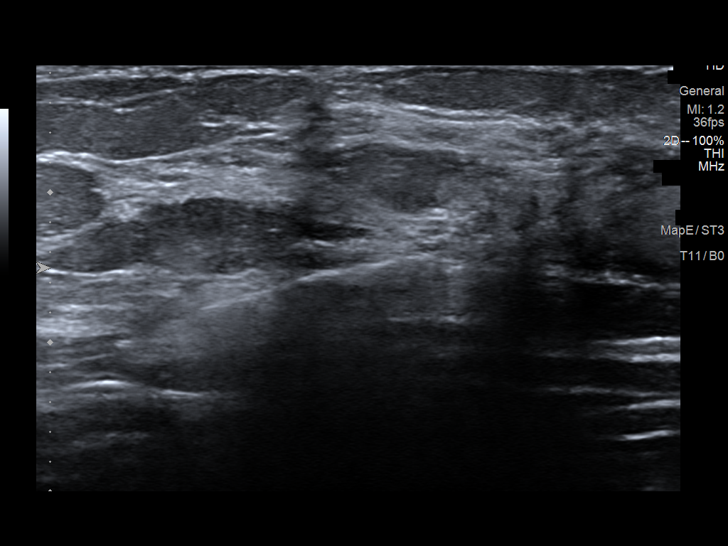
[im 4/10]
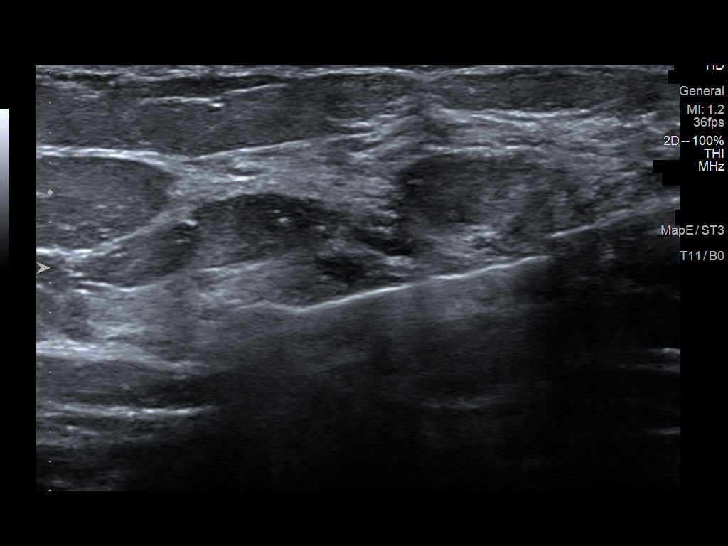
[im 5/10]
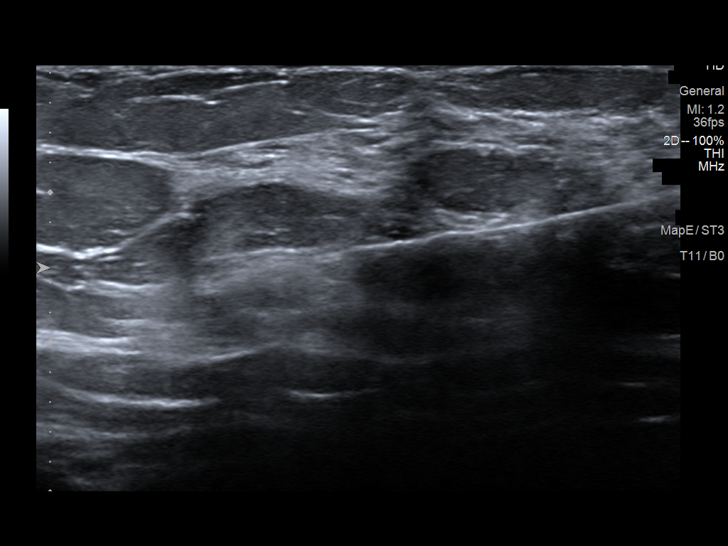
[im 6/10]
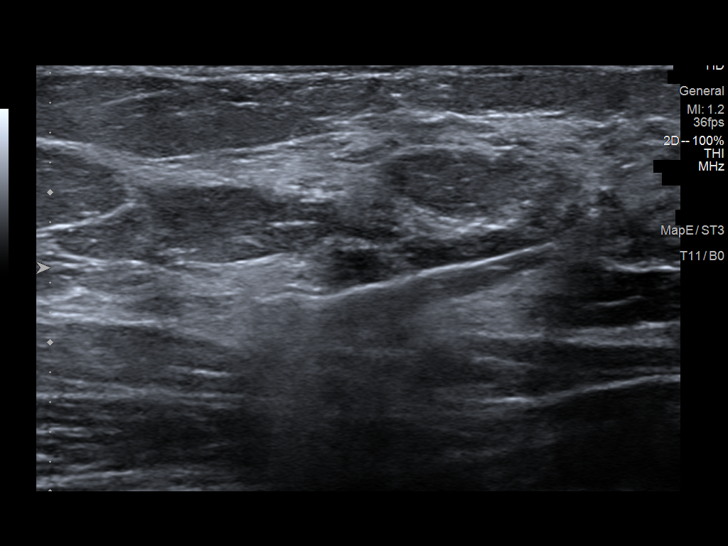
[im 7/10]
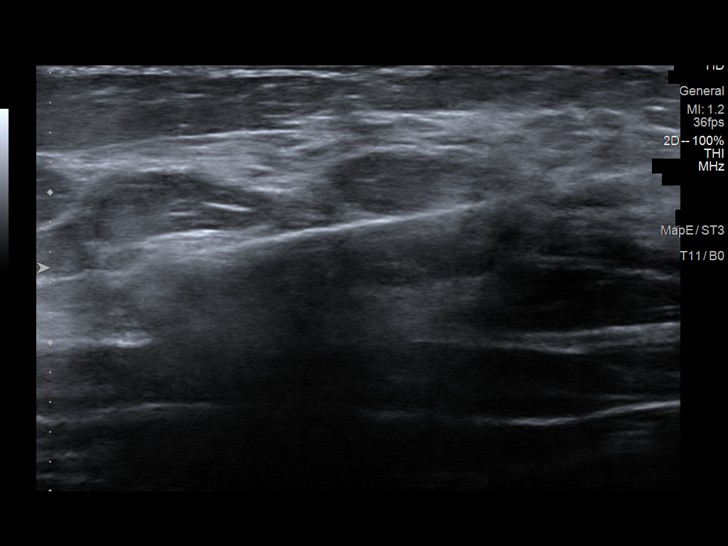
[im 8/10]
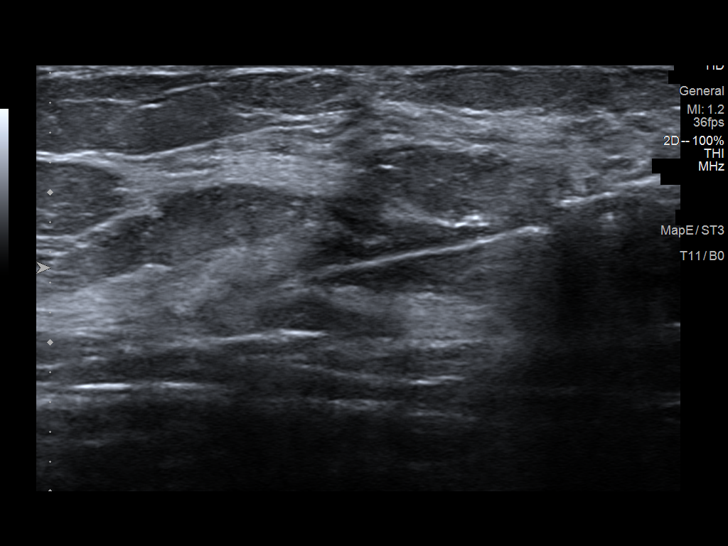
[im 9/10]
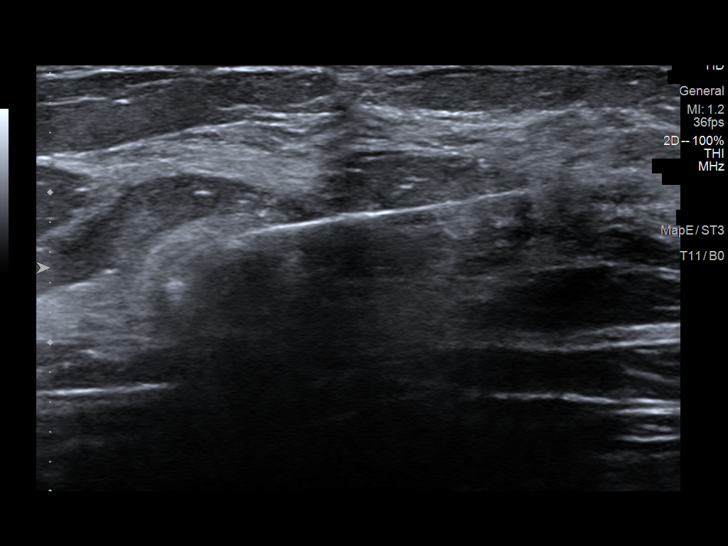
[im 10/10]
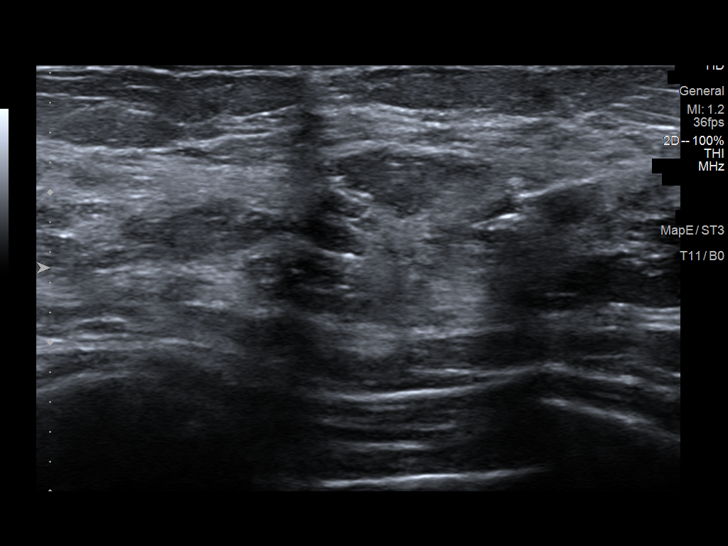

[10 of 10 positions shown; findings below may reference images not displayed]



Using sterile technique and 1% Lidocaine as local anesthetic, under
direct ultrasound visualization, a 14 gauge Doll device was
used to perform biopsy of mass in the left breast at 1137 using a
lateral approach. At the conclusion of the procedure a ribbon shaped
tissue marker clip was deployed into the biopsy cavity. Follow up 2
view mammogram was performed and dictated separately.
IMPRESSION: Ultrasound guided biopsy of a mass in the left breast at 1137. No
apparent complications.

## 2016-04-22 IMAGING — MG MM DIAGNOSTIC UNILATERAL L
2 series · 2 of 2 positions shown · non-contrast
Comparison: Previous exam(s).

CLINICAL DATA: Post biopsy mammogram of the left breast for clip
placement.

EXAM:
DIAGNOSTIC LEFT MAMMOGRAM POST ULTRASOUND BIOPSY

[L CC]
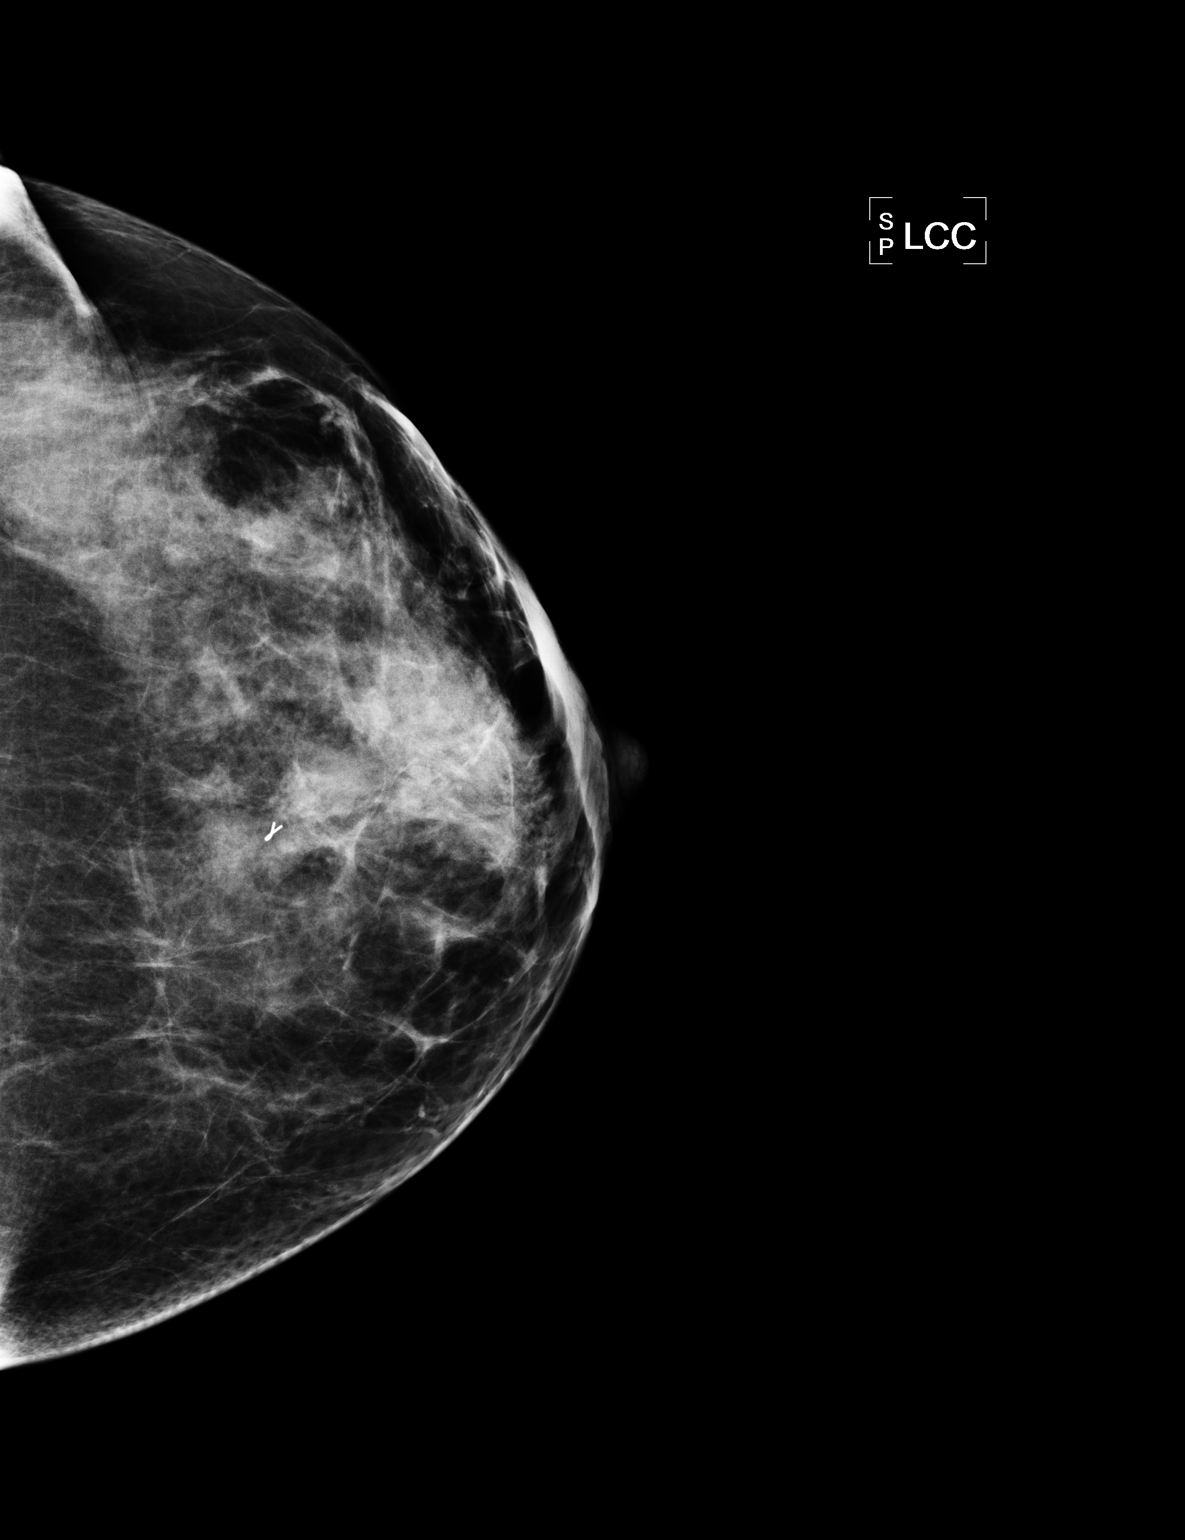

[L ML]
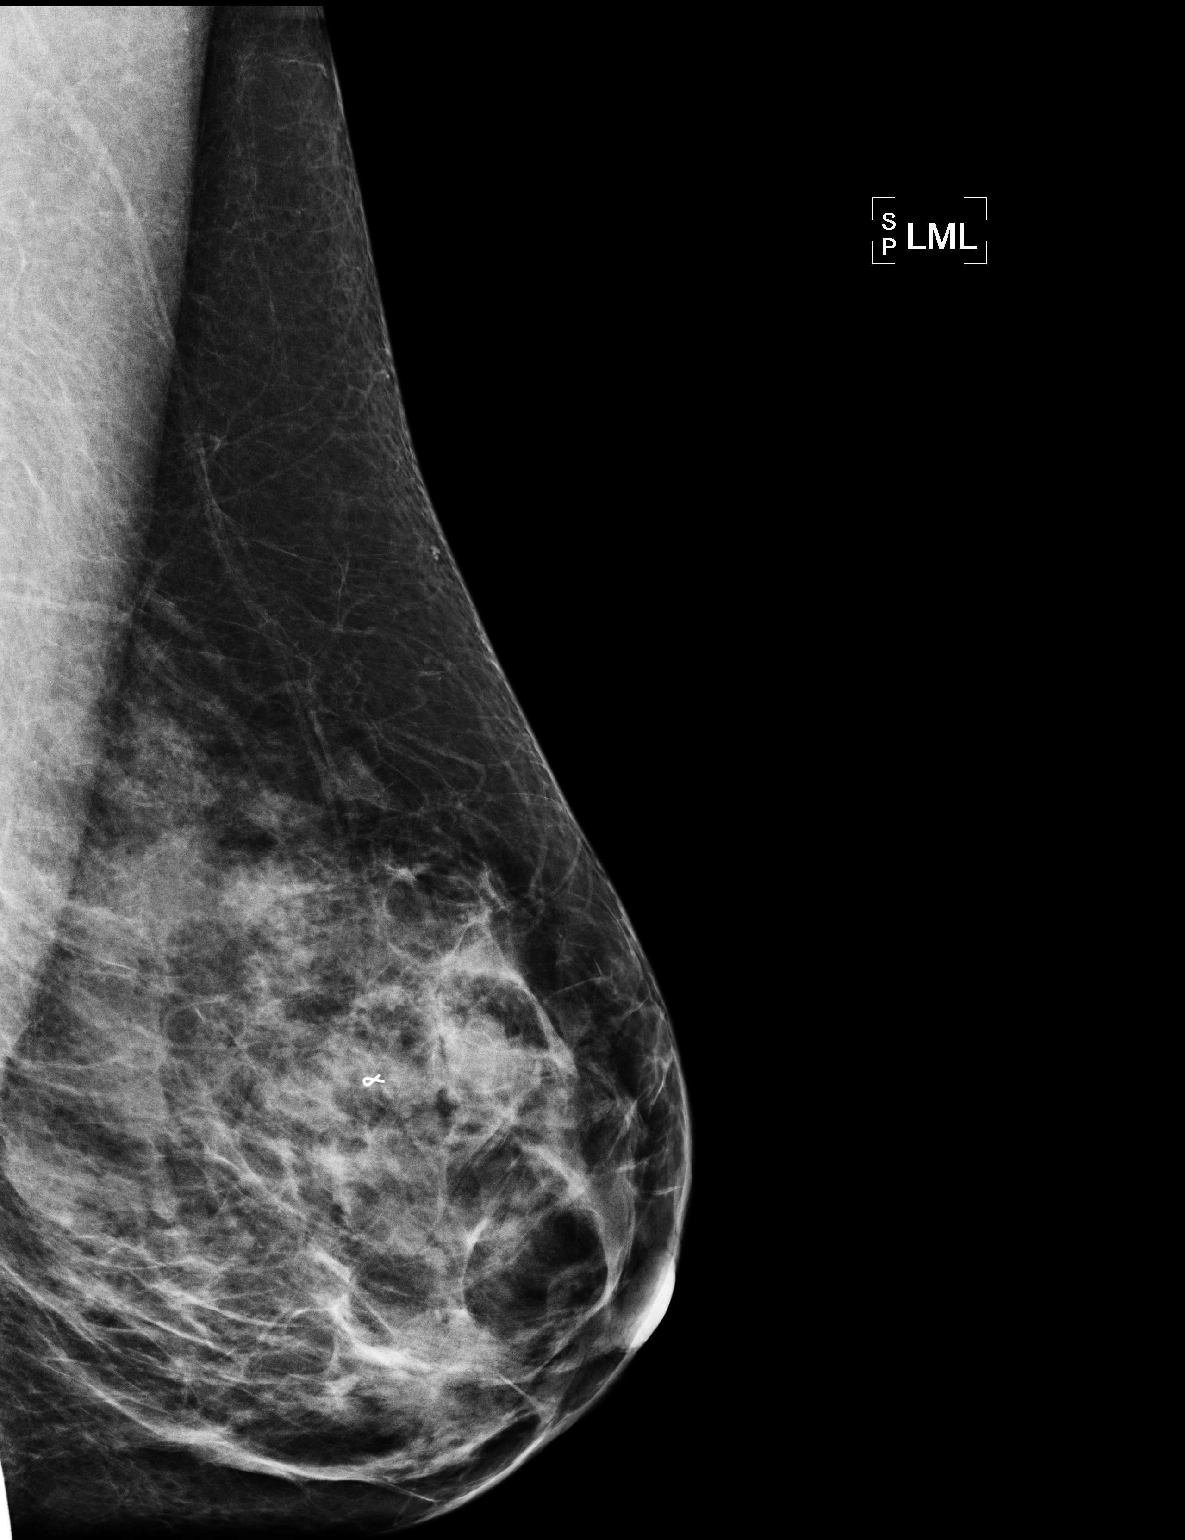

[2 of 2 positions shown; findings below may reference images not displayed]

FINDINGS: Mammographic images were obtained following ultrasound guided biopsy
of mass in the left breast at 9999. The ribbon shaped biopsy marking
clip is appropriately positioned at the intended site of biopsy in
the left breast at 9999.
IMPRESSION: Appropriate positioning of the ribbon shaped biopsy marking clip at
9999 in the left breast.

Final Assessment: Post Procedure Mammograms for Marker Placement

## 2016-05-10 ENCOUNTER — Other Ambulatory Visit: Payer: Self-pay | Admitting: Obstetrics and Gynecology

## 2016-05-10 DIAGNOSIS — N6320 Unspecified lump in the left breast, unspecified quadrant: Secondary | ICD-10-CM

## 2016-05-22 ENCOUNTER — Ambulatory Visit
Admission: RE | Admit: 2016-05-22 | Discharge: 2016-05-22 | Disposition: A | Discharge status: Home / Self Care | Payer: BC Managed Care – PPO | Source: Ambulatory Visit | Attending: Obstetrics and Gynecology | Admitting: Obstetrics and Gynecology

## 2016-05-22 DIAGNOSIS — N6320 Unspecified lump in the left breast, unspecified quadrant: Secondary | ICD-10-CM

## 2016-05-22 DIAGNOSIS — R922 Inconclusive mammogram: Secondary | ICD-10-CM | POA: Diagnosis not present

## 2016-05-22 DIAGNOSIS — N63 Unspecified lump in breast: Secondary | ICD-10-CM | POA: Diagnosis not present

## 2016-05-23 DIAGNOSIS — L299 Pruritus, unspecified: Secondary | ICD-10-CM | POA: Diagnosis not present

## 2016-05-23 DIAGNOSIS — D225 Melanocytic nevi of trunk: Secondary | ICD-10-CM | POA: Diagnosis not present

## 2016-05-23 DIAGNOSIS — L301 Dyshidrosis [pompholyx]: Secondary | ICD-10-CM | POA: Diagnosis not present

## 2016-10-23 DIAGNOSIS — Z23 Encounter for immunization: Secondary | ICD-10-CM | POA: Diagnosis not present

## 2016-10-23 DIAGNOSIS — G43909 Migraine, unspecified, not intractable, without status migrainosus: Secondary | ICD-10-CM | POA: Diagnosis not present

## 2016-10-23 DIAGNOSIS — F900 Attention-deficit hyperactivity disorder, predominantly inattentive type: Secondary | ICD-10-CM | POA: Diagnosis not present

## 2017-03-14 DIAGNOSIS — R1031 Right lower quadrant pain: Secondary | ICD-10-CM | POA: Diagnosis not present

## 2017-03-14 DIAGNOSIS — Z6823 Body mass index (BMI) 23.0-23.9, adult: Secondary | ICD-10-CM | POA: Diagnosis not present

## 2017-04-03 ENCOUNTER — Other Ambulatory Visit: Payer: Self-pay | Admitting: Obstetrics and Gynecology

## 2017-04-03 DIAGNOSIS — Z6822 Body mass index (BMI) 22.0-22.9, adult: Secondary | ICD-10-CM | POA: Diagnosis not present

## 2017-04-03 DIAGNOSIS — Z01419 Encounter for gynecological examination (general) (routine) without abnormal findings: Secondary | ICD-10-CM | POA: Diagnosis not present

## 2017-04-03 DIAGNOSIS — Z113 Encounter for screening for infections with a predominantly sexual mode of transmission: Secondary | ICD-10-CM | POA: Diagnosis not present

## 2017-04-03 DIAGNOSIS — Z124 Encounter for screening for malignant neoplasm of cervix: Secondary | ICD-10-CM | POA: Diagnosis not present

## 2017-04-05 LAB — CYTOLOGY - PAP

## 2017-04-23 DIAGNOSIS — G43909 Migraine, unspecified, not intractable, without status migrainosus: Secondary | ICD-10-CM | POA: Diagnosis not present

## 2017-04-23 DIAGNOSIS — F900 Attention-deficit hyperactivity disorder, predominantly inattentive type: Secondary | ICD-10-CM | POA: Diagnosis not present

## 2017-06-01 ENCOUNTER — Other Ambulatory Visit: Payer: Self-pay | Admitting: Obstetrics and Gynecology

## 2017-06-01 DIAGNOSIS — Z1231 Encounter for screening mammogram for malignant neoplasm of breast: Secondary | ICD-10-CM

## 2017-06-11 ENCOUNTER — Ambulatory Visit
Admission: RE | Admit: 2017-06-11 | Discharge: 2017-06-11 | Disposition: A | Discharge status: Home / Self Care | Payer: BC Managed Care – PPO | Source: Ambulatory Visit | Attending: Obstetrics and Gynecology | Admitting: Obstetrics and Gynecology

## 2017-06-11 DIAGNOSIS — Z1231 Encounter for screening mammogram for malignant neoplasm of breast: Secondary | ICD-10-CM

## 2017-11-18 DIAGNOSIS — J039 Acute tonsillitis, unspecified: Secondary | ICD-10-CM | POA: Diagnosis not present

## 2017-12-03 DIAGNOSIS — G43909 Migraine, unspecified, not intractable, without status migrainosus: Secondary | ICD-10-CM | POA: Diagnosis not present

## 2017-12-03 DIAGNOSIS — F900 Attention-deficit hyperactivity disorder, predominantly inattentive type: Secondary | ICD-10-CM | POA: Diagnosis not present

## 2017-12-03 DIAGNOSIS — B373 Candidiasis of vulva and vagina: Secondary | ICD-10-CM | POA: Diagnosis not present

## 2017-12-03 DIAGNOSIS — E785 Hyperlipidemia, unspecified: Secondary | ICD-10-CM | POA: Diagnosis not present

## 2017-12-03 DIAGNOSIS — E049 Nontoxic goiter, unspecified: Secondary | ICD-10-CM | POA: Diagnosis not present

## 2017-12-04 ENCOUNTER — Other Ambulatory Visit: Payer: Self-pay | Admitting: Family Medicine

## 2017-12-04 DIAGNOSIS — E049 Nontoxic goiter, unspecified: Secondary | ICD-10-CM

## 2017-12-11 ENCOUNTER — Ambulatory Visit
Admission: RE | Admit: 2017-12-11 | Discharge: 2017-12-11 | Disposition: A | Discharge status: Home / Self Care | Payer: BLUE CROSS/BLUE SHIELD | Source: Ambulatory Visit | Attending: Family Medicine | Admitting: Family Medicine

## 2017-12-11 DIAGNOSIS — E042 Nontoxic multinodular goiter: Secondary | ICD-10-CM | POA: Diagnosis not present

## 2017-12-11 DIAGNOSIS — E049 Nontoxic goiter, unspecified: Secondary | ICD-10-CM

## 2018-02-07 IMAGING — MG 2D DIGITAL SCREENING BILATERAL MAMMOGRAM WITH CAD AND ADJUNCT TO
9 of 12 series · 9 of 28 positions shown · non-contrast
Comparison: Previous exam(s).

CLINICAL DATA: Screening.

EXAM:
2D DIGITAL SCREENING BILATERAL MAMMOGRAM WITH CAD AND ADJUNCT TOMO

[L MLO synth-2D]
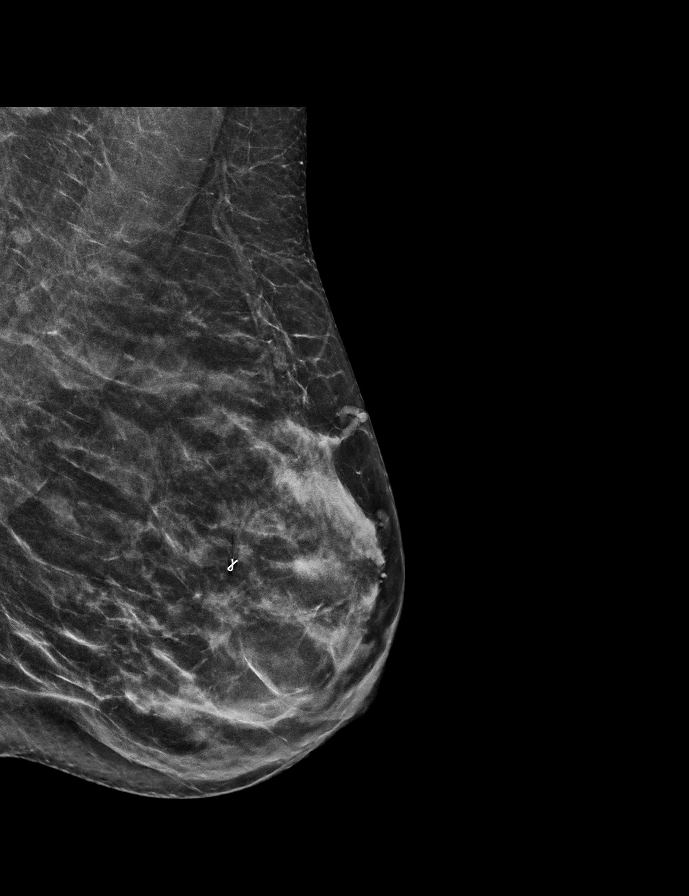

[L CC synth-2D]
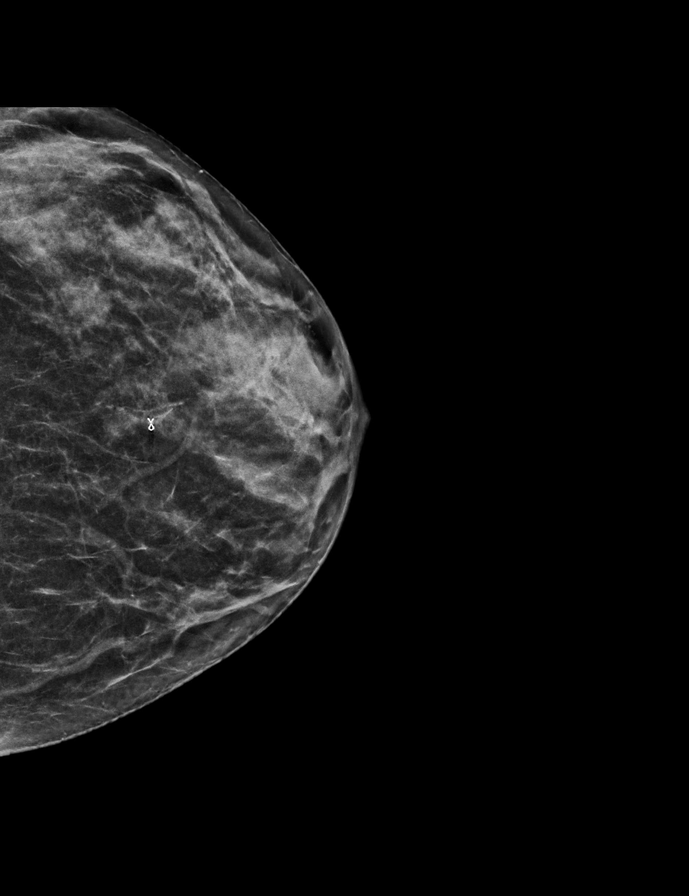

[L MLO]
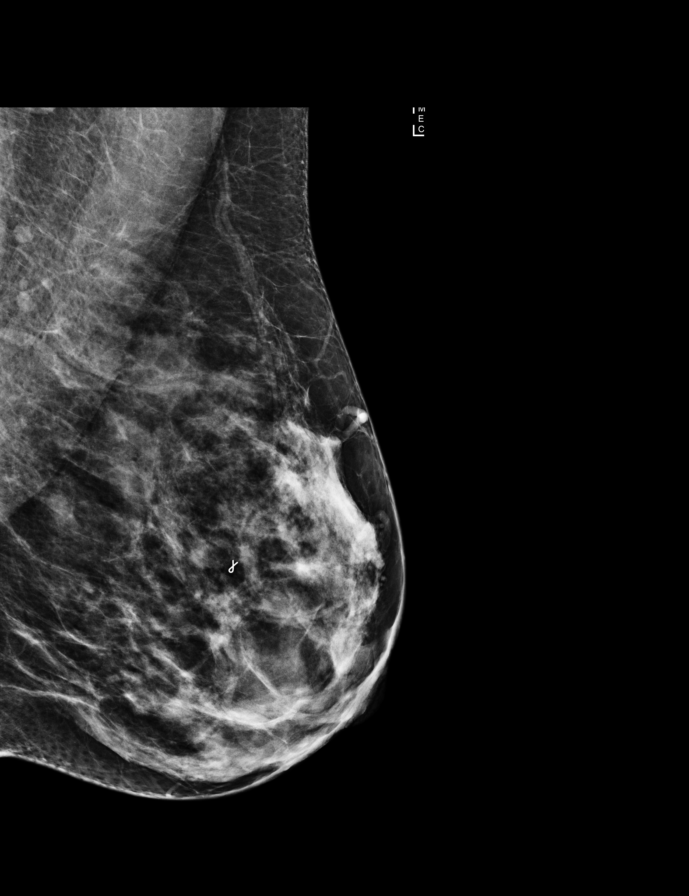

[R MLO]
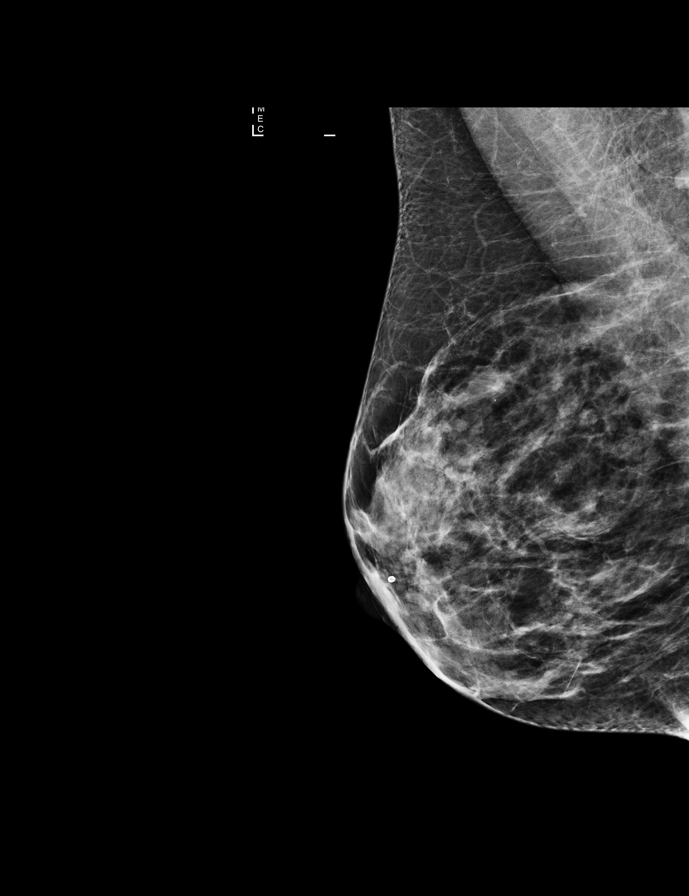

[R MLO synth-2D]
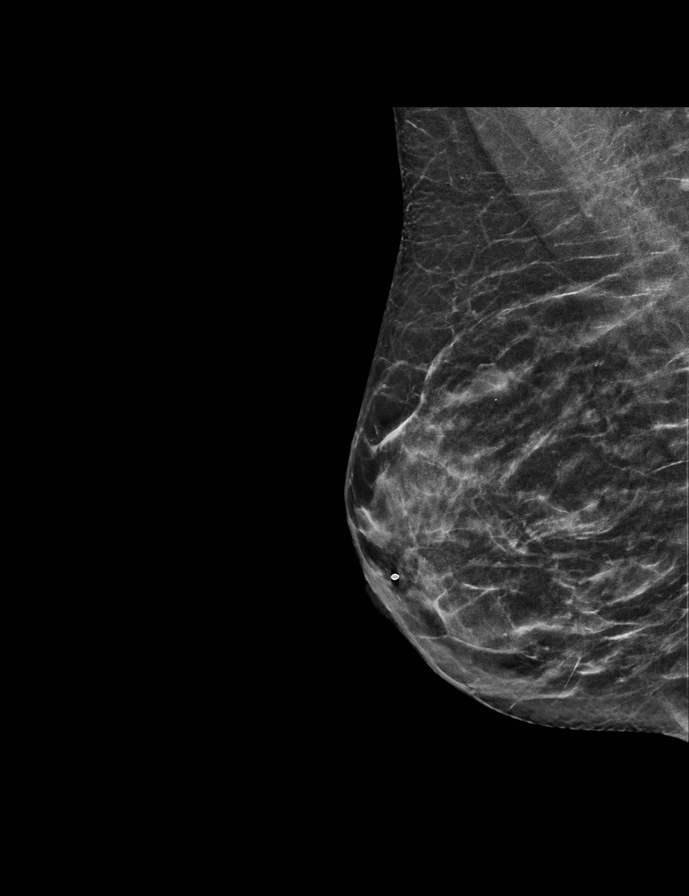

[R CC synth-2D]
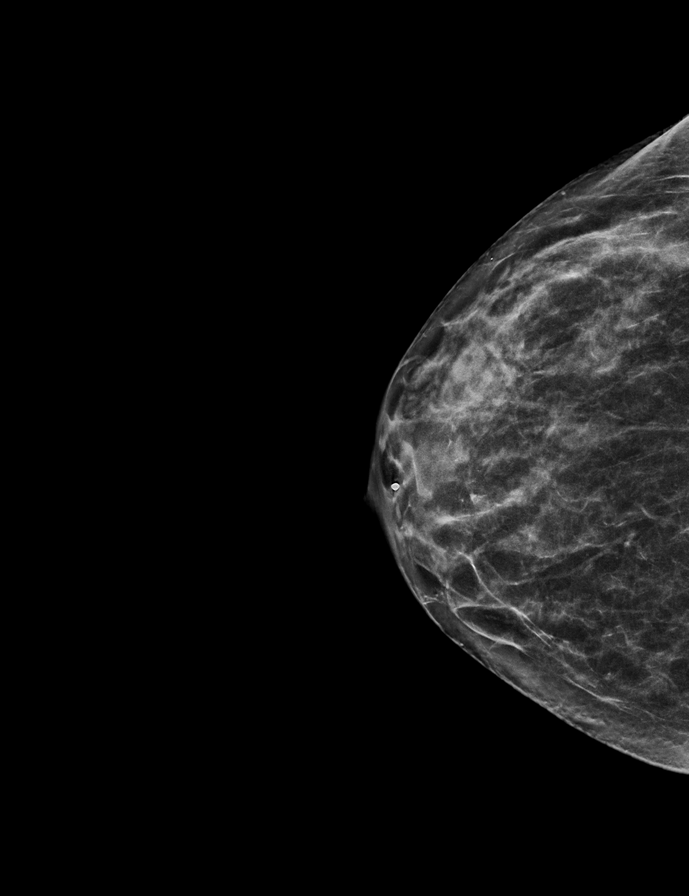

[L CC]
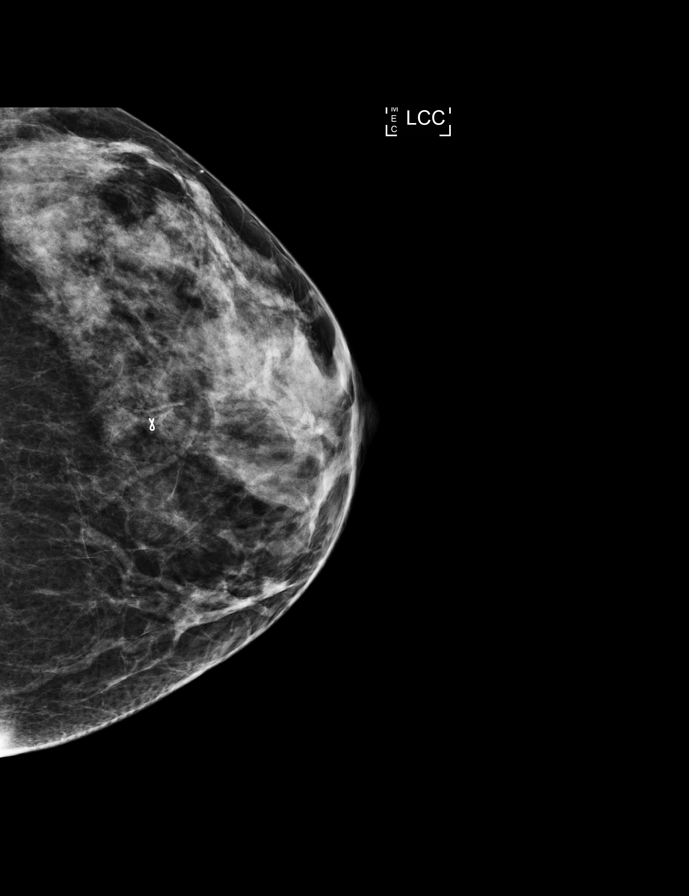

[R CC]
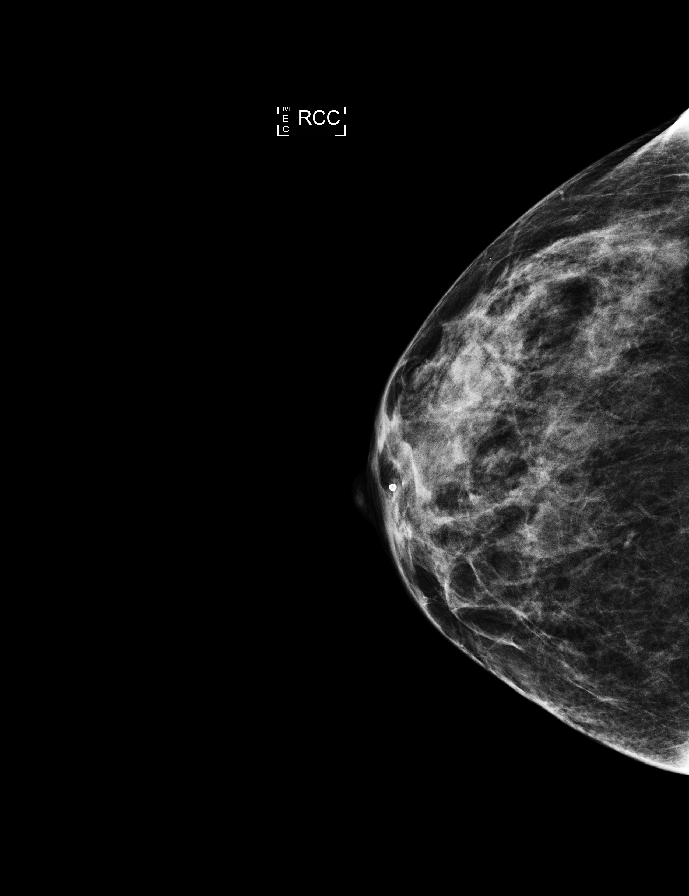

[R MLO tomo · tomo slice 26/51.0]
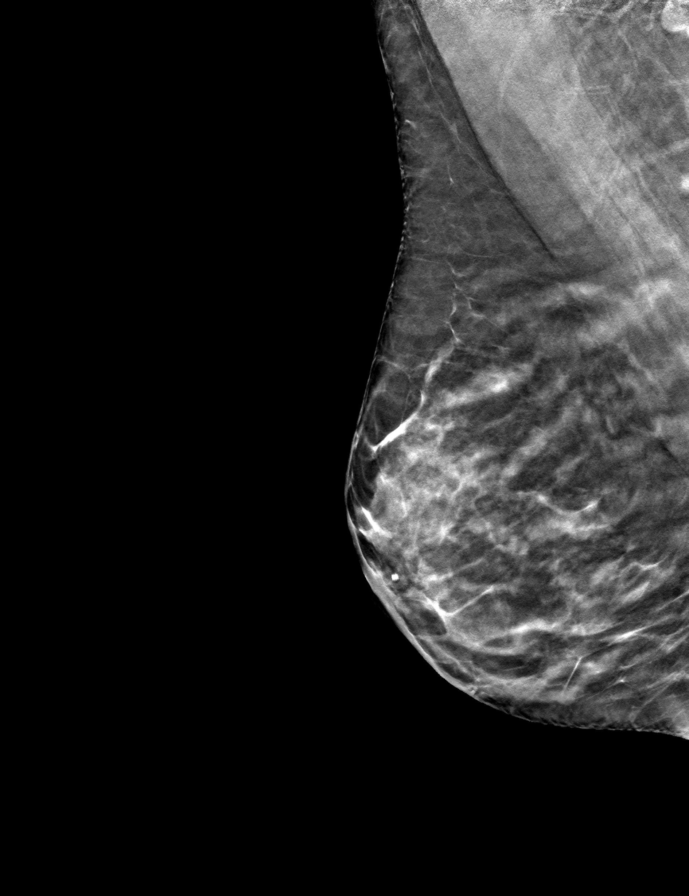

[9 of 28 positions shown; findings below may reference images not displayed]

ACR Breast Density Category c: The breast tissue is heterogeneously
dense, which may obscure small masses.
FINDINGS: There are no findings suspicious for malignancy. Images were
processed with CAD.
IMPRESSION: No mammographic evidence of malignancy. A result letter of this
screening mammogram will be mailed directly to the patient.

RECOMMENDATION:
Screening mammogram in one year. (Code:[id number])

BI-RADS CATEGORY  1: Negative.

## 2018-05-01 DIAGNOSIS — Z6823 Body mass index (BMI) 23.0-23.9, adult: Secondary | ICD-10-CM | POA: Diagnosis not present

## 2018-05-01 DIAGNOSIS — Z124 Encounter for screening for malignant neoplasm of cervix: Secondary | ICD-10-CM | POA: Diagnosis not present

## 2018-05-01 DIAGNOSIS — Z3202 Encounter for pregnancy test, result negative: Secondary | ICD-10-CM | POA: Diagnosis not present

## 2018-05-01 DIAGNOSIS — Z1231 Encounter for screening mammogram for malignant neoplasm of breast: Secondary | ICD-10-CM | POA: Diagnosis not present

## 2018-05-01 DIAGNOSIS — Z113 Encounter for screening for infections with a predominantly sexual mode of transmission: Secondary | ICD-10-CM | POA: Diagnosis not present

## 2018-05-01 DIAGNOSIS — Z01419 Encounter for gynecological examination (general) (routine) without abnormal findings: Secondary | ICD-10-CM | POA: Diagnosis not present

## 2018-05-30 DIAGNOSIS — R599 Enlarged lymph nodes, unspecified: Secondary | ICD-10-CM | POA: Diagnosis not present

## 2018-06-11 DIAGNOSIS — J029 Acute pharyngitis, unspecified: Secondary | ICD-10-CM | POA: Diagnosis not present

## 2018-07-12 DIAGNOSIS — F902 Attention-deficit hyperactivity disorder, combined type: Secondary | ICD-10-CM | POA: Diagnosis not present

## 2019-02-21 DIAGNOSIS — F900 Attention-deficit hyperactivity disorder, predominantly inattentive type: Secondary | ICD-10-CM | POA: Diagnosis not present

## 2019-02-21 DIAGNOSIS — L219 Seborrheic dermatitis, unspecified: Secondary | ICD-10-CM | POA: Diagnosis not present

## 2019-02-21 DIAGNOSIS — G43909 Migraine, unspecified, not intractable, without status migrainosus: Secondary | ICD-10-CM | POA: Diagnosis not present

## 2019-02-21 DIAGNOSIS — K219 Gastro-esophageal reflux disease without esophagitis: Secondary | ICD-10-CM | POA: Diagnosis not present

## 2019-08-20 DIAGNOSIS — F900 Attention-deficit hyperactivity disorder, predominantly inattentive type: Secondary | ICD-10-CM | POA: Diagnosis not present

## 2019-08-20 DIAGNOSIS — K219 Gastro-esophageal reflux disease without esophagitis: Secondary | ICD-10-CM | POA: Diagnosis not present

## 2019-09-13 DIAGNOSIS — Z20828 Contact with and (suspected) exposure to other viral communicable diseases: Secondary | ICD-10-CM | POA: Diagnosis not present

## 2019-09-17 DIAGNOSIS — U071 COVID-19: Secondary | ICD-10-CM | POA: Diagnosis not present

## 2019-11-21 DIAGNOSIS — Z01419 Encounter for gynecological examination (general) (routine) without abnormal findings: Secondary | ICD-10-CM | POA: Diagnosis not present

## 2019-11-21 DIAGNOSIS — Z6823 Body mass index (BMI) 23.0-23.9, adult: Secondary | ICD-10-CM | POA: Diagnosis not present

## 2019-11-21 DIAGNOSIS — Z1231 Encounter for screening mammogram for malignant neoplasm of breast: Secondary | ICD-10-CM | POA: Diagnosis not present

## 2020-03-18 DIAGNOSIS — G43909 Migraine, unspecified, not intractable, without status migrainosus: Secondary | ICD-10-CM | POA: Diagnosis not present

## 2020-03-18 DIAGNOSIS — K219 Gastro-esophageal reflux disease without esophagitis: Secondary | ICD-10-CM | POA: Diagnosis not present

## 2020-03-18 DIAGNOSIS — F900 Attention-deficit hyperactivity disorder, predominantly inattentive type: Secondary | ICD-10-CM | POA: Diagnosis not present

## 2020-03-18 DIAGNOSIS — Z8616 Personal history of COVID-19: Secondary | ICD-10-CM | POA: Diagnosis not present

## 2020-03-24 DIAGNOSIS — J343 Hypertrophy of nasal turbinates: Secondary | ICD-10-CM | POA: Diagnosis not present

## 2020-03-24 DIAGNOSIS — R430 Anosmia: Secondary | ICD-10-CM | POA: Diagnosis not present

## 2020-03-24 DIAGNOSIS — J310 Chronic rhinitis: Secondary | ICD-10-CM | POA: Diagnosis not present

## 2020-09-15 DIAGNOSIS — L219 Seborrheic dermatitis, unspecified: Secondary | ICD-10-CM | POA: Diagnosis not present

## 2020-09-15 DIAGNOSIS — G43909 Migraine, unspecified, not intractable, without status migrainosus: Secondary | ICD-10-CM | POA: Diagnosis not present

## 2020-09-15 DIAGNOSIS — L309 Dermatitis, unspecified: Secondary | ICD-10-CM | POA: Diagnosis not present

## 2020-09-15 DIAGNOSIS — F900 Attention-deficit hyperactivity disorder, predominantly inattentive type: Secondary | ICD-10-CM | POA: Diagnosis not present

## 2021-01-24 DIAGNOSIS — Z1231 Encounter for screening mammogram for malignant neoplasm of breast: Secondary | ICD-10-CM | POA: Diagnosis not present

## 2021-01-24 DIAGNOSIS — Z01419 Encounter for gynecological examination (general) (routine) without abnormal findings: Secondary | ICD-10-CM | POA: Diagnosis not present

## 2021-04-26 DIAGNOSIS — G43909 Migraine, unspecified, not intractable, without status migrainosus: Secondary | ICD-10-CM | POA: Diagnosis not present

## 2021-04-26 DIAGNOSIS — F900 Attention-deficit hyperactivity disorder, predominantly inattentive type: Secondary | ICD-10-CM | POA: Diagnosis not present

## 2021-04-26 DIAGNOSIS — L309 Dermatitis, unspecified: Secondary | ICD-10-CM | POA: Diagnosis not present

## 2021-04-26 DIAGNOSIS — L219 Seborrheic dermatitis, unspecified: Secondary | ICD-10-CM | POA: Diagnosis not present

## 2021-11-02 DIAGNOSIS — E785 Hyperlipidemia, unspecified: Secondary | ICD-10-CM | POA: Diagnosis not present

## 2021-11-02 DIAGNOSIS — F900 Attention-deficit hyperactivity disorder, predominantly inattentive type: Secondary | ICD-10-CM | POA: Diagnosis not present

## 2021-11-02 DIAGNOSIS — G43909 Migraine, unspecified, not intractable, without status migrainosus: Secondary | ICD-10-CM | POA: Diagnosis not present

## 2022-04-09 DIAGNOSIS — J309 Allergic rhinitis, unspecified: Secondary | ICD-10-CM | POA: Diagnosis not present

## 2022-04-09 DIAGNOSIS — J0100 Acute maxillary sinusitis, unspecified: Secondary | ICD-10-CM | POA: Diagnosis not present

## 2022-04-25 DIAGNOSIS — Z6822 Body mass index (BMI) 22.0-22.9, adult: Secondary | ICD-10-CM | POA: Diagnosis not present

## 2022-04-25 DIAGNOSIS — Z124 Encounter for screening for malignant neoplasm of cervix: Secondary | ICD-10-CM | POA: Diagnosis not present

## 2022-04-25 DIAGNOSIS — Z1231 Encounter for screening mammogram for malignant neoplasm of breast: Secondary | ICD-10-CM | POA: Diagnosis not present

## 2022-04-25 DIAGNOSIS — Z01419 Encounter for gynecological examination (general) (routine) without abnormal findings: Secondary | ICD-10-CM | POA: Diagnosis not present

## 2022-05-03 DIAGNOSIS — F900 Attention-deficit hyperactivity disorder, predominantly inattentive type: Secondary | ICD-10-CM | POA: Diagnosis not present

## 2022-05-03 DIAGNOSIS — E049 Nontoxic goiter, unspecified: Secondary | ICD-10-CM | POA: Diagnosis not present

## 2022-05-03 DIAGNOSIS — G43909 Migraine, unspecified, not intractable, without status migrainosus: Secondary | ICD-10-CM | POA: Diagnosis not present

## 2022-10-18 DIAGNOSIS — F900 Attention-deficit hyperactivity disorder, predominantly inattentive type: Secondary | ICD-10-CM | POA: Diagnosis not present

## 2022-12-10 DIAGNOSIS — J324 Chronic pansinusitis: Secondary | ICD-10-CM | POA: Diagnosis not present

## 2022-12-10 DIAGNOSIS — R051 Acute cough: Secondary | ICD-10-CM | POA: Diagnosis not present

## 2022-12-10 DIAGNOSIS — R0981 Nasal congestion: Secondary | ICD-10-CM | POA: Diagnosis not present

## 2022-12-10 DIAGNOSIS — H6692 Otitis media, unspecified, left ear: Secondary | ICD-10-CM | POA: Diagnosis not present

## 2022-12-27 DIAGNOSIS — J040 Acute laryngitis: Secondary | ICD-10-CM | POA: Diagnosis not present

## 2022-12-27 DIAGNOSIS — J309 Allergic rhinitis, unspecified: Secondary | ICD-10-CM | POA: Diagnosis not present

## 2023-05-16 DIAGNOSIS — M542 Cervicalgia: Secondary | ICD-10-CM | POA: Diagnosis not present

## 2023-05-16 DIAGNOSIS — M62838 Other muscle spasm: Secondary | ICD-10-CM | POA: Diagnosis not present

## 2023-05-16 DIAGNOSIS — M9902 Segmental and somatic dysfunction of thoracic region: Secondary | ICD-10-CM | POA: Diagnosis not present

## 2023-05-16 DIAGNOSIS — M9901 Segmental and somatic dysfunction of cervical region: Secondary | ICD-10-CM | POA: Diagnosis not present

## 2023-05-29 DIAGNOSIS — Z6823 Body mass index (BMI) 23.0-23.9, adult: Secondary | ICD-10-CM | POA: Diagnosis not present

## 2023-05-29 DIAGNOSIS — Z01419 Encounter for gynecological examination (general) (routine) without abnormal findings: Secondary | ICD-10-CM | POA: Diagnosis not present

## 2023-05-29 DIAGNOSIS — Z1231 Encounter for screening mammogram for malignant neoplasm of breast: Secondary | ICD-10-CM | POA: Diagnosis not present

## 2023-05-30 DIAGNOSIS — G43909 Migraine, unspecified, not intractable, without status migrainosus: Secondary | ICD-10-CM | POA: Diagnosis not present

## 2023-05-30 DIAGNOSIS — F900 Attention-deficit hyperactivity disorder, predominantly inattentive type: Secondary | ICD-10-CM | POA: Diagnosis not present

## 2023-07-13 DIAGNOSIS — R070 Pain in throat: Secondary | ICD-10-CM | POA: Diagnosis not present

## 2023-07-13 DIAGNOSIS — R509 Fever, unspecified: Secondary | ICD-10-CM | POA: Diagnosis not present

## 2023-08-01 DIAGNOSIS — L821 Other seborrheic keratosis: Secondary | ICD-10-CM | POA: Diagnosis not present

## 2023-08-22 DIAGNOSIS — M542 Cervicalgia: Secondary | ICD-10-CM | POA: Diagnosis not present

## 2023-08-22 DIAGNOSIS — M9901 Segmental and somatic dysfunction of cervical region: Secondary | ICD-10-CM | POA: Diagnosis not present

## 2023-08-22 DIAGNOSIS — M9902 Segmental and somatic dysfunction of thoracic region: Secondary | ICD-10-CM | POA: Diagnosis not present

## 2023-08-22 DIAGNOSIS — M62838 Other muscle spasm: Secondary | ICD-10-CM | POA: Diagnosis not present

## 2023-08-30 DIAGNOSIS — G43909 Migraine, unspecified, not intractable, without status migrainosus: Secondary | ICD-10-CM | POA: Diagnosis not present

## 2023-08-30 DIAGNOSIS — G44209 Tension-type headache, unspecified, not intractable: Secondary | ICD-10-CM | POA: Diagnosis not present

## 2023-08-30 DIAGNOSIS — F900 Attention-deficit hyperactivity disorder, predominantly inattentive type: Secondary | ICD-10-CM | POA: Diagnosis not present

## 2023-11-08 DIAGNOSIS — M62838 Other muscle spasm: Secondary | ICD-10-CM | POA: Diagnosis not present

## 2023-11-08 DIAGNOSIS — M9902 Segmental and somatic dysfunction of thoracic region: Secondary | ICD-10-CM | POA: Diagnosis not present

## 2023-11-08 DIAGNOSIS — M9901 Segmental and somatic dysfunction of cervical region: Secondary | ICD-10-CM | POA: Diagnosis not present

## 2023-12-03 DIAGNOSIS — G43909 Migraine, unspecified, not intractable, without status migrainosus: Secondary | ICD-10-CM | POA: Diagnosis not present

## 2023-12-03 DIAGNOSIS — F900 Attention-deficit hyperactivity disorder, predominantly inattentive type: Secondary | ICD-10-CM | POA: Diagnosis not present

## 2024-05-12 DIAGNOSIS — E049 Nontoxic goiter, unspecified: Secondary | ICD-10-CM | POA: Diagnosis not present

## 2024-05-12 DIAGNOSIS — G43909 Migraine, unspecified, not intractable, without status migrainosus: Secondary | ICD-10-CM | POA: Diagnosis not present

## 2024-05-12 DIAGNOSIS — E785 Hyperlipidemia, unspecified: Secondary | ICD-10-CM | POA: Diagnosis not present

## 2024-05-12 DIAGNOSIS — F900 Attention-deficit hyperactivity disorder, predominantly inattentive type: Secondary | ICD-10-CM | POA: Diagnosis not present

## 2024-06-09 DIAGNOSIS — Z01419 Encounter for gynecological examination (general) (routine) without abnormal findings: Secondary | ICD-10-CM | POA: Diagnosis not present

## 2024-06-09 DIAGNOSIS — Z1231 Encounter for screening mammogram for malignant neoplasm of breast: Secondary | ICD-10-CM | POA: Diagnosis not present

## 2024-06-13 ENCOUNTER — Other Ambulatory Visit: Payer: Self-pay | Admitting: Obstetrics and Gynecology

## 2024-06-13 DIAGNOSIS — R928 Other abnormal and inconclusive findings on diagnostic imaging of breast: Secondary | ICD-10-CM

## 2024-06-20 ENCOUNTER — Ambulatory Visit
Admission: RE | Admit: 2024-06-20 | Discharge: 2024-06-20 | Disposition: A | Discharge status: Home / Self Care | Source: Ambulatory Visit | Attending: Obstetrics and Gynecology | Admitting: Obstetrics and Gynecology

## 2024-06-20 DIAGNOSIS — R928 Other abnormal and inconclusive findings on diagnostic imaging of breast: Secondary | ICD-10-CM | POA: Diagnosis not present

## 2024-06-20 DIAGNOSIS — N6002 Solitary cyst of left breast: Secondary | ICD-10-CM | POA: Diagnosis not present

## 2024-10-12 DIAGNOSIS — R21 Rash and other nonspecific skin eruption: Secondary | ICD-10-CM | POA: Diagnosis not present

## 2024-10-12 DIAGNOSIS — L03213 Periorbital cellulitis: Secondary | ICD-10-CM | POA: Diagnosis not present
